# Patient Record
Sex: Male | Born: 1970 | Race: Black or African American | Hispanic: No | Smoking: Former smoker
Health system: Southern US, Community
[De-identification: ages and names within clinical notes are randomized; demographics above are authoritative.]

## PROBLEM LIST (undated history)

## (undated) DIAGNOSIS — I824Z1 Acute embolism and thrombosis of unspecified deep veins of right distal lower extremity: Secondary | ICD-10-CM

## (undated) DIAGNOSIS — F419 Anxiety disorder, unspecified: Secondary | ICD-10-CM

## (undated) DIAGNOSIS — F329 Major depressive disorder, single episode, unspecified: Secondary | ICD-10-CM

## (undated) DIAGNOSIS — F32A Depression, unspecified: Secondary | ICD-10-CM

## (undated) DIAGNOSIS — W3400XA Accidental discharge from unspecified firearms or gun, initial encounter: Secondary | ICD-10-CM

## (undated) HISTORY — PX: WRIST SURGERY: SHX841

## (undated) HISTORY — DX: Depression, unspecified: F32.A

## (undated) HISTORY — DX: Anxiety disorder, unspecified: F41.9

## (undated) HISTORY — DX: Acute embolism and thrombosis of unspecified deep veins of right distal lower extremity: I82.4Z1

## (undated) HISTORY — PX: BACK SURGERY: SHX140

---
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
## 2013-06-29 ENCOUNTER — Emergency Department (HOSPITAL_BASED_OUTPATIENT_CLINIC_OR_DEPARTMENT_OTHER)
Admission: EM | Admit: 2013-06-29 | Discharge: 2013-06-29 | Disposition: A | Discharge status: Home / Self Care | Payer: BC Managed Care – HMO | Attending: Emergency Medicine | Admitting: Emergency Medicine

## 2013-06-29 ENCOUNTER — Encounter (HOSPITAL_BASED_OUTPATIENT_CLINIC_OR_DEPARTMENT_OTHER): Payer: Self-pay | Admitting: Emergency Medicine

## 2013-06-29 DIAGNOSIS — Z791 Long term (current) use of non-steroidal anti-inflammatories (NSAID): Secondary | ICD-10-CM | POA: Insufficient documentation

## 2013-06-29 DIAGNOSIS — F17200 Nicotine dependence, unspecified, uncomplicated: Secondary | ICD-10-CM | POA: Insufficient documentation

## 2013-06-29 DIAGNOSIS — M25551 Pain in right hip: Secondary | ICD-10-CM

## 2013-06-29 DIAGNOSIS — M25559 Pain in unspecified hip: Secondary | ICD-10-CM | POA: Insufficient documentation

## 2013-06-29 MED ORDER — OXYCODONE-ACETAMINOPHEN 5-325 MG PO TABS: 2.0000 | ORAL_TABLET | Freq: Four times a day (QID) | ORAL | Status: DC | PRN

## 2013-06-29 MED ORDER — IBUPROFEN 800 MG PO TABS: 800.0000 mg | ORAL_TABLET | Freq: Three times a day (TID) | ORAL | Status: DC

## 2013-06-29 NOTE — ED Notes (Signed)
Patient here with ongoing bilateral hip pain for 2 months. Had cortisone injection in right hip 1 month ago and still continuing to have bilateral hip pain, denies any injury. Patient also has attempted to follow up with original MD unsuccessfully. Pain with any movement and states he is uncomfortable majority of the time, states diagnosed with bursitis.

## 2013-06-29 NOTE — ED Provider Notes (Signed)
CSN: [id number]     Arrival date & time 06/29/13  1058 History   First MD Initiated Contact with Patient 06/29/13 1239     Chief Complaint  Patient presents with  . Hip Pain   (Consider location/radiation/quality/duration/timing/severity/associated sxs/prior Treatment) HPI This 42 year old male has a 2 month gradual onset history of constant pain in both hips worse with position changes and movement better if he stays still, his pain is present 24 hours a day and moderately severe, he has no fever no IVDA no trauma no back pain no neck pain no radiation of his pain down his legs no weakness or numbness no change in bowel or bladder function no abdominal pain no vomiting no dysuria no rash, he was seen in [facility] emergency department and received temporary improvement without resolution with a suspected steroid shot in his right lateral hip area and temporary oral pain medicines,  History reviewed. No pertinent past medical history. History reviewed. No pertinent past surgical history. No family history on file. History  Substance Use Topics  . Smoking status: Current Every Day Smoker -- 0.50 packs/day    Types: Cigarettes  . Smokeless tobacco: Not on file  . Alcohol Use: Not on file    Review of Systems 10 Systems reviewed and are negative for acute change except as noted in the HPI. Allergies  Review of patient's allergies indicates no known allergies.  Home Medications   Current Outpatient Rx  Name  Route  Sig  Dispense  Refill  . ibuprofen (ADVIL,MOTRIN) 800 MG tablet   Oral   Take 1 tablet (800 mg total) by mouth 3 (three) times daily.   21 tablet   0   . oxyCODONE-acetaminophen (PERCOCET) 5-325 MG per tablet   Oral   Take 2 tablets by mouth every 6 (six) hours as needed for severe pain.   20 tablet   0    BP 125/74  Pulse 60  Temp(Src) 97.8 F (36.6 C) (Oral)  Resp 18  Ht 5\' 8"  (1.727 m)  Wt 195 lb (88.451 kg)  BMI 29.66 kg/m2  SpO2  100% Physical Exam  Nursing note and vitals reviewed. Constitutional:  Awake, alert, nontoxic appearance with baseline speech.  HENT:  Head: Atraumatic.  Eyes: Pupils are equal, round, and reactive to light. Right eye exhibits no discharge. Left eye exhibits no discharge.  Neck: Neck supple.  Cardiovascular: Normal rate and regular rhythm.   No murmur heard. Pulmonary/Chest: Effort normal and breath sounds normal. No respiratory distress. He has no wheezes. He has no rales. He exhibits no tenderness.  Abdominal: Soft. Bowel sounds are normal. He exhibits no mass. There is no tenderness. There is no rebound.  Musculoskeletal:       Thoracic back: He exhibits no tenderness.       Lumbar back: He exhibits no tenderness.  Bilateral lower extremities non tender except for bilateral lateral aspects of his hips over the greater trochanter region and lateral proximal thighs bilaterally without new rashes or color change, baseline ROM with intact DP pulses, CR<2 secs all digits bilaterally, sensation baseline light touch bilaterally for pt, motor symmetric bilateral 5 / 5 hip flexion, quadriceps, hamstrings, EHL, foot dorsiflexion, foot plantarflexion, gait somewhat antalgic but without apparent new ataxia. Back is nontender including sacroiliac joints.  Neurological: He is alert.  Mental status baseline for patient.  Upper extremity motor strength and sensation intact and symmetric bilaterally.  Skin: No rash noted.  Psychiatric: He has a normal  mood and affect.    ED Course  Procedures (including critical care time) Patient / Family / Caregiver informed of clinical course, understand medical decision-making process, and agree with plan. Labs Review Labs Reviewed - No data to display Imaging Review No results found.  EKG Interpretation   None       MDM   1. Bilateral hip pain    I doubt any other EMC precluding discharge at this time including, but not necessarily limited to the  following:SBI, cauda equina syndrome, radiculopathy.    Hurman Horn, MD 06/30/13 2107

## 2013-07-03 ENCOUNTER — Encounter: Payer: Self-pay | Admitting: Family Medicine

## 2013-07-03 ENCOUNTER — Ambulatory Visit (INDEPENDENT_AMBULATORY_CARE_PROVIDER_SITE_OTHER): Payer: BC Managed Care – HMO | Admitting: Family Medicine

## 2013-07-03 VITALS — BP 119/82 | HR 52 | Ht 68.0 in | Wt 192.0 lb

## 2013-07-03 DIAGNOSIS — M79609 Pain in unspecified limb: Secondary | ICD-10-CM

## 2013-07-03 DIAGNOSIS — M79604 Pain in right leg: Secondary | ICD-10-CM

## 2013-07-03 DIAGNOSIS — M545 Low back pain: Secondary | ICD-10-CM

## 2013-07-03 MED ORDER — PREDNISONE (PAK) 10 MG PO TABS: ORAL_TABLET | ORAL | Status: DC

## 2013-07-03 MED ORDER — OXYCODONE-ACETAMINOPHEN 5-325 MG PO TABS: 1.0000 | ORAL_TABLET | Freq: Four times a day (QID) | ORAL | Status: DC | PRN

## 2013-07-03 MED ORDER — CYCLOBENZAPRINE HCL 10 MG PO TABS: 10.0000 mg | ORAL_TABLET | Freq: Three times a day (TID) | ORAL | Status: DC | PRN

## 2013-07-03 NOTE — Patient Instructions (Signed)
You have lumbar radiculopathy (a pinched nerve in your low back going into both legs). Take tylenol for baseline pain relief (1-2 extra strength tabs 3x/day) A prednisone dose pack is the best option for immediate relief and may be prescribed. Day after finishing prednisone start aleve 2 tabs twice a day with food for pain and inflammation. Percocet as needed for severe pain (no driving on this medicine). Flexeril as needed for muscle spasms (no driving on this medicine if it makes you sleepy). Stay as active as possible. Physical therapy has been shown to be helpful as well. Strengthening of low back muscles, abdominal musculature are key for long term pain relief. If not improving, will consider further imaging (MRI). Follow up with me in 6 days.

## 2013-07-06 ENCOUNTER — Encounter: Payer: Self-pay | Admitting: Family Medicine

## 2013-07-06 NOTE — Progress Notes (Signed)
Patient ID: [name], male   DOB: 06/12/1971, 42 y.o.   MRN: [id number]  PCP: No PCP Per Patient  Subjective:   HPI: Patient is a 42 y.o. male here for bilateral hip/back pain.  Patient denies known injury. States he has had pain in low back radiating to groin on both sides for over a year. Worse with sitting, lying down. Has tried heating pad, icy hot, ibuprofen, percocet. Seemed to worsen about 4 months ago after sleeping on sister's floor No numbness or tingling. Had bursa injection on right side and after 2-3 days he improved some but pain worsened again. No bowel/bladder dysfunction.  History reviewed. No pertinent past medical history.  No current outpatient prescriptions on file prior to visit.   No current facility-administered medications on file prior to visit.    Past Surgical History  Procedure Laterality Date  . Wrist surgery Right     No Known Allergies  History   Social History  . Marital Status: Married    Spouse Name: N/A    Number of Children: N/A  . Years of Education: N/A   Occupational History  . Not on file.   Social History Main Topics  . Smoking status: Current Every Day Smoker -- 0.50 packs/day    Types: Cigarettes  . Smokeless tobacco: Not on file  . Alcohol Use: Not on file  . Drug Use: Not on file  . Sexual Activity: Not on file   Other Topics Concern  . Not on file   Social History Narrative  . No narrative on file    Family History  Problem Relation Age of Onset  . Sudden death Neg Hx   . Hypertension Neg Hx   . Hyperlipidemia Neg Hx   . Heart attack Neg Hx   . Diabetes Neg Hx     BP 119/82  Pulse 52  Ht 5\' 8"  (1.727 m)  Wt 192 lb (87.091 kg)  BMI 29.20 kg/m2  Review of Systems: See HPI above.    Objective:  Physical Exam:  Gen: NAD  Back: No gross deformity, scoliosis. TTP bilateral paraspinal lumbar regions, diffusely about hips.  No midline or bony TTP. FROM with pain on flexion. Strength 4/5 with  knee flexion and extension, left ankle dorsiflexion.  5/5 other muscle groups.   2+ MSRs in patellar and achilles tendons, equal bilaterally. Negative SLRs. Sensation intact to light touch bilaterally. Negative logroll bilateral hips Negative fabers and piriformis stretches.    Assessment & Plan:  1. Low back pain - with radiation into both groins but normal hip exam.  Has weakness on both sides but limited by pain.  Will start with prednisone dose pack, flexeril and percocet as needed.  Consider formal PT.  MRI if not improving.  F/u in 1 week.

## 2013-07-14 ENCOUNTER — Encounter: Payer: Self-pay | Admitting: Family Medicine

## 2013-07-14 ENCOUNTER — Ambulatory Visit: Payer: BC Managed Care – HMO | Admitting: Family Medicine

## 2013-07-14 DIAGNOSIS — M545 Low back pain: Secondary | ICD-10-CM | POA: Insufficient documentation

## 2013-07-14 NOTE — Assessment & Plan Note (Signed)
with radiation into both groins but normal hip exam.  Has weakness on both sides but limited by pain.  Will start with prednisone dose pack, flexeril and percocet as needed.  Consider formal PT.  MRI if not improving.  F/u in 1 week.

## 2013-07-15 ENCOUNTER — Ambulatory Visit: Payer: Self-pay | Admitting: Family Medicine

## 2013-08-08 ENCOUNTER — Ambulatory Visit (INDEPENDENT_AMBULATORY_CARE_PROVIDER_SITE_OTHER): Payer: BC Managed Care – HMO | Admitting: Family Medicine

## 2013-08-08 ENCOUNTER — Encounter: Payer: Self-pay | Admitting: Family Medicine

## 2013-08-08 VITALS — BP 147/75 | HR 76 | Ht 68.0 in | Wt 195.0 lb

## 2013-08-08 DIAGNOSIS — M545 Low back pain, unspecified: Secondary | ICD-10-CM

## 2013-08-08 DIAGNOSIS — M79604 Pain in right leg: Secondary | ICD-10-CM

## 2013-08-08 DIAGNOSIS — M79609 Pain in unspecified limb: Secondary | ICD-10-CM

## 2013-08-08 DIAGNOSIS — M79605 Pain in left leg: Principal | ICD-10-CM

## 2013-08-08 MED ORDER — CYCLOBENZAPRINE HCL 10 MG PO TABS: 10.0000 mg | ORAL_TABLET | Freq: Three times a day (TID) | ORAL | Status: AC | PRN

## 2013-08-08 NOTE — Patient Instructions (Signed)
You have lumbar radiculopathy (a pinched nerve in your low back going into both legs). Take tylenol for baseline pain relief (1-2 extra strength tabs 3x/day) Aleve 2 tabs twice a day with food for pain and inflammation. Flexeril as needed for muscle spasms (no driving on this medicine if it makes you sleepy). Stay as active as possible. Physical therapy has been shown to be helpful as well - start this and do home exercises on days you don't go to therapy. Strengthening of low back muscles, abdominal musculature are key for long term pain relief. If not improving, will consider further imaging (MRI). Follow up with me in 1 month to 6 weeks. Can call me sooner if therapy not working to go ahead with MRI.

## 2013-08-13 ENCOUNTER — Encounter: Payer: Self-pay | Admitting: Family Medicine

## 2013-08-13 NOTE — Progress Notes (Signed)
Patient ID: [name], male   DOB: 1971/04/23, 43 y.o.   MRN: [id number]  PCP: No PCP Per Patient  Subjective:   HPI: Patient is a 43 y.o. male here for bilateral hip/back pain.  12/18: Patient denies known injury. States he has had pain in low back radiating to groin on both sides for over a year. Worse with sitting, lying down. Has tried heating pad, icy hot, ibuprofen, percocet. Seemed to worsen about 4 months ago after sleeping on sister's floor No numbness or tingling. Had bursa injection on right side and after 2-3 days he improved some but pain worsened again. No bowel/bladder dysfunction.  1/23: Patient reports the prednisone and flexeril made him functional, some improvement. Reports now having left foot going to sleep occasionally. No bowel/bladder dysfunction. Using muscle relaxant still. Pain level a 5/10 currently.  History reviewed. No pertinent past medical history.  Current Outpatient Prescriptions on File Prior to Visit  Medication Sig Dispense Refill  . oxyCODONE-acetaminophen (PERCOCET) 5-325 MG per tablet Take 1 tablet by mouth every 6 (six) hours as needed for severe pain.  60 tablet  0  . predniSONE (STERAPRED UNI-PAK) 10 MG tablet 6 tabs po day 1, 5 tabs po day 2, 4 tabs po day 3, 3 tabs po day 4, 2 tabs po day 5, 1 tab po day 6  21 tablet  0   No current facility-administered medications on file prior to visit.    Past Surgical History  Procedure Laterality Date  . Wrist surgery Right     No Known Allergies  History   Social History  . Marital Status: Married    Spouse Name: N/A    Number of Children: N/A  . Years of Education: N/A   Occupational History  . Not on file.   Social History Main Topics  . Smoking status: Current Every Day Smoker -- 0.50 packs/day    Types: Cigarettes  . Smokeless tobacco: Not on file  . Alcohol Use: Not on file  . Drug Use: Not on file  . Sexual Activity: Not on file   Other Topics Concern  . Not on  file   Social History Narrative  . No narrative on file    Family History  Problem Relation Age of Onset  . Sudden death Neg Hx   . Hypertension Neg Hx   . Hyperlipidemia Neg Hx   . Heart attack Neg Hx   . Diabetes Neg Hx     BP 147/75  Pulse 76  Ht 5\' 8"  (1.727 m)  Wt 195 lb (88.451 kg)  BMI 29.66 kg/m2  Review of Systems: See HPI above.    Objective:  Physical Exam:  Gen: NAD  Back: No gross deformity, scoliosis. TTP bilateral paraspinal lumbar regions, diffusely about hips.  No midline or bony TTP. FROM with pain on flexion. Strength 4/5 with knee flexion and extension, left ankle dorsiflexion.  5/5 other muscle groups.   2+ MSRs in patellar and achilles tendons, equal bilaterally. Negative SLRs. Sensation intact to light touch bilaterally. Negative logroll bilateral hips Negative fabers and piriformis stretches.    Assessment & Plan:  1. Low back pain - with radiation into both groins but normal hip exam.  Exam otherwise unchanged.  Prednisone only helped some.  Discussed options moving forward.  Will start with formal physical therapy.  If not improving still will go ahead with MRI to further assess.

## 2013-08-13 NOTE — Assessment & Plan Note (Signed)
with radiation into both groins but normal hip exam.  Exam otherwise unchanged.  Prednisone only helped some.  Discussed options moving forward.  Will start with formal physical therapy.  If not improving still will go ahead with MRI to further assess.

## 2013-08-15 ENCOUNTER — Ambulatory Visit: Payer: BC Managed Care – HMO | Admitting: Family Medicine

## 2013-08-18 ENCOUNTER — Ambulatory Visit: Payer: BC Managed Care – HMO | Attending: Family Medicine | Admitting: Rehabilitation

## 2013-08-28 ENCOUNTER — Ambulatory Visit: Payer: BC Managed Care – HMO | Admitting: Rehabilitation

## 2013-08-29 ENCOUNTER — Ambulatory Visit: Payer: BC Managed Care – HMO | Admitting: Family Medicine

## 2015-05-23 ENCOUNTER — Encounter (HOSPITAL_BASED_OUTPATIENT_CLINIC_OR_DEPARTMENT_OTHER): Payer: Self-pay | Admitting: Emergency Medicine

## 2015-05-23 ENCOUNTER — Emergency Department (HOSPITAL_BASED_OUTPATIENT_CLINIC_OR_DEPARTMENT_OTHER): Payer: Self-pay

## 2015-05-23 ENCOUNTER — Emergency Department (HOSPITAL_BASED_OUTPATIENT_CLINIC_OR_DEPARTMENT_OTHER)
Admission: EM | Admit: 2015-05-23 | Discharge: 2015-05-23 | Disposition: A | Discharge status: Home / Self Care | Payer: Self-pay | Attending: Emergency Medicine | Admitting: Emergency Medicine

## 2015-05-23 DIAGNOSIS — S51811D Laceration without foreign body of right forearm, subsequent encounter: Secondary | ICD-10-CM | POA: Insufficient documentation

## 2015-05-23 DIAGNOSIS — Z720 Tobacco use: Secondary | ICD-10-CM | POA: Insufficient documentation

## 2015-05-23 DIAGNOSIS — Z23 Encounter for immunization: Secondary | ICD-10-CM | POA: Insufficient documentation

## 2015-05-23 DIAGNOSIS — W25XXXD Contact with sharp glass, subsequent encounter: Secondary | ICD-10-CM | POA: Insufficient documentation

## 2015-05-23 MED ORDER — TETANUS-DIPHTH-ACELL PERTUSSIS 5-2.5-18.5 LF-MCG/0.5 IM SUSP
0.5000 mL | Freq: Once | INTRAMUSCULAR | Status: AC
Start: 2015-05-23 — End: 2015-05-23
  Administered 2015-05-23: 0.5 mL via INTRAMUSCULAR
  Filled 2015-05-23: qty 0.5

## 2015-05-23 NOTE — Discharge Instructions (Signed)
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
## 2015-05-23 NOTE — ED Provider Notes (Signed)
CSN: [id number]     Arrival date & time 05/23/15  1129 History   First MD Initiated Contact with Patient 05/23/15 1149     Chief Complaint  Patient presents with  . Laceration     (Consider location/radiation/quality/duration/timing/severity/associated sxs/prior Treatment) Patient is a 44 y.o. male presenting with skin laceration.  Laceration Location: R forearm. Length (cm):  3 Bleeding: controlled   Time since incident:  1 day Laceration mechanism:  Broken glass Pain details:    Quality:  Aching Foreign body present:  No foreign bodies Relieved by:  Nothing Worsened by:  Movement and pressure Tetanus status:  Unknown   History reviewed. No pertinent past medical history. Past Surgical History  Procedure Laterality Date  . Wrist surgery Right    Family History  Problem Relation Age of Onset  . Sudden death Neg Hx   . Hypertension Neg Hx   . Hyperlipidemia Neg Hx   . Heart attack Neg Hx   . Diabetes Neg Hx    Social History  Substance Use Topics  . Smoking status: Current Every Day Smoker -- 0.50 packs/day    Types: Cigarettes  . Smokeless tobacco: None  . Alcohol Use: None    Review of Systems  All other systems reviewed and are negative.     Allergies  Review of patient's allergies indicates no known allergies.  Home Medications   Prior to Admission medications   Not on File   BP 133/73 mmHg  Pulse 69  Temp(Src) 98.4 F (36.9 C) (Oral)  Resp 16  Ht 5\' 7"  (1.702 m)  Wt 191 lb (86.637 kg)  BMI 29.91 kg/m2  SpO2 100% Physical Exam  Constitutional: He is oriented to person, place, and time. He appears well-developed and well-nourished.  HENT:  Head: Normocephalic and atraumatic.  Eyes: Conjunctivae and EOM are normal.  Neck: Normal range of motion. Neck supple.  Cardiovascular: Normal rate, regular rhythm and normal heart sounds.   Pulmonary/Chest: Effort normal and breath sounds normal. No respiratory distress.  Abdominal: He exhibits no  distension. There is no tenderness. There is no rebound and no guarding.  Musculoskeletal: Normal range of motion.       Right forearm: He exhibits tenderness and swelling (hematoma underlying laceration of forearm which is stapled, no overlying warmth or erythema).  NV intact distal to injury, but with subj tingling in 4-5 digit of R hand, FROM  Neurological: He is alert and oriented to person, place, and time.  Skin: Skin is warm and dry.  Vitals reviewed.   ED Course  Procedures (including critical care time) Labs Review Labs Reviewed - No data to display  Imaging Review Dg Forearm Right  05/23/2015  CLINICAL DATA:  Injured forearm today. Put arm through a glass window. Assess for radiopaque foreign body. EXAM: RIGHT FOREARM - 2 VIEW COMPARISON:  None. FINDINGS: The distal radius plate and screws is intact. No wrist, forearm or elbow fractures identified. There is diffuse soft tissue swelling involving the proximal forearm with surgical skin staples in place. No definite radiopaque foreign body is identified. IMPRESSION: No fracture or radiopaque foreign body. Electronically Signed   By: Marijo Sanes M.D.   On: 05/23/2015 12:49   I have personally reviewed and evaluated these images and lab results as part of my medical decision-making.   EKG Interpretation None      MDM   Final diagnoses:  Forearm laceration, right, subsequent encounter    43 y.o. male with pertinent PMH of recent  laceration to R forearm, seen at OSH and had wound stapled presents with request for tetanus update as well as concern for swelling at site of laceration. Patient states no imaging was performed. Wound was stapled and bleeding was controlled. On arrival today vitals signs and physical exam as above. There is a hematoma overlying the laceration is significant, however the patient still has neurovascular function and has both ulnar and radial pulses of his right arm.  Suspect this is resolving venous  bleeding, tetanus updated, x-ray obtained to rule out retained glass which was unremarkable. Discharged home in stable condition to follow-up with hand surgery as needed    I have reviewed all laboratory and imaging studies if ordered as above  1. Forearm laceration, right, subsequent encounter         Debby Freiberg, MD 05/23/15 1444

## 2015-05-23 NOTE — ED Notes (Signed)
Pt made aware to return if symptoms worsen or if any life threatening symptoms occur.   

## 2016-06-06 DIAGNOSIS — K612 Anorectal abscess: Secondary | ICD-10-CM | POA: Insufficient documentation

## 2017-07-19 ENCOUNTER — Emergency Department (HOSPITAL_BASED_OUTPATIENT_CLINIC_OR_DEPARTMENT_OTHER)
Admission: EM | Admit: 2017-07-19 | Discharge: 2017-07-19 | Disposition: A | Discharge status: Home / Self Care | Payer: Self-pay | Attending: Emergency Medicine | Admitting: Emergency Medicine

## 2017-07-19 ENCOUNTER — Encounter (HOSPITAL_BASED_OUTPATIENT_CLINIC_OR_DEPARTMENT_OTHER): Payer: Self-pay | Admitting: Emergency Medicine

## 2017-07-19 DIAGNOSIS — F17210 Nicotine dependence, cigarettes, uncomplicated: Secondary | ICD-10-CM | POA: Insufficient documentation

## 2017-07-19 DIAGNOSIS — R369 Urethral discharge, unspecified: Secondary | ICD-10-CM | POA: Insufficient documentation

## 2017-07-19 DIAGNOSIS — Z202 Contact with and (suspected) exposure to infections with a predominantly sexual mode of transmission: Secondary | ICD-10-CM | POA: Insufficient documentation

## 2017-07-19 MED ORDER — CEFTRIAXONE SODIUM 250 MG IJ SOLR
250.0000 mg | Freq: Once | INTRAMUSCULAR | Status: AC
  Administered 2017-07-19: 250 mg via INTRAMUSCULAR
  Filled 2017-07-19: qty 250

## 2017-07-19 MED ORDER — LIDOCAINE HCL (PF) 1 % IJ SOLN
INTRAMUSCULAR | Status: AC
  Filled 2017-07-19: qty 5

## 2017-07-19 MED ORDER — AZITHROMYCIN 250 MG PO TABS
1000.0000 mg | ORAL_TABLET | Freq: Once | ORAL | Status: AC
  Administered 2017-07-19: 1000 mg via ORAL
  Filled 2017-07-19: qty 4
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
## 2017-07-19 NOTE — ED Notes (Signed)
Provider notified that "Shot time" was over
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
## 2017-07-19 NOTE — ED Notes (Signed)
Pt understood dc material. NAD noted. 

## 2017-07-19 NOTE — Discharge Instructions (Signed)
It was our pleasure to provide your ER care today - we hope that you feel better.  Follow up with primary care doctor in the next 1-2 weeks.  Return to ER if worse, new symptoms, other concern.

## 2017-07-20 LAB — GC/CHLAMYDIA PROBE AMP (~~LOC~~) NOT AT ARMC
Chlamydia: NEGATIVE
Neisseria Gonorrhea: NEGATIVE

## 2017-07-26 DIAGNOSIS — S82142A Displaced bicondylar fracture of left tibia, initial encounter for closed fracture: Secondary | ICD-10-CM | POA: Insufficient documentation

## 2017-12-29 ENCOUNTER — Inpatient Hospital Stay (HOSPITAL_COMMUNITY): Payer: Self-pay | Admitting: Anesthesiology

## 2017-12-29 ENCOUNTER — Emergency Department (HOSPITAL_COMMUNITY): Payer: Self-pay | Admitting: Certified Registered"

## 2017-12-29 ENCOUNTER — Other Ambulatory Visit: Payer: Self-pay

## 2017-12-29 ENCOUNTER — Encounter (HOSPITAL_COMMUNITY): Payer: Self-pay | Admitting: *Deleted

## 2017-12-29 ENCOUNTER — Inpatient Hospital Stay (HOSPITAL_COMMUNITY): Payer: Self-pay

## 2017-12-29 ENCOUNTER — Inpatient Hospital Stay (HOSPITAL_COMMUNITY)
Admission: EM | Admit: 2017-12-29 | Discharge: 2018-01-14 | DRG: 957 | Disposition: A | Discharge status: Rehabilitation Facility | Payer: Self-pay | Attending: General Surgery | Admitting: General Surgery

## 2017-12-29 ENCOUNTER — Encounter (HOSPITAL_COMMUNITY): Admission: EM | Disposition: A | Discharge status: Rehabilitation Facility | Payer: Self-pay | Source: Home / Self Care

## 2017-12-29 ENCOUNTER — Emergency Department (HOSPITAL_COMMUNITY): Payer: Self-pay

## 2017-12-29 DIAGNOSIS — S91331A Puncture wound without foreign body, right foot, initial encounter: Secondary | ICD-10-CM | POA: Diagnosis present

## 2017-12-29 DIAGNOSIS — J9601 Acute respiratory failure with hypoxia: Secondary | ICD-10-CM | POA: Diagnosis present

## 2017-12-29 DIAGNOSIS — R402253 Coma scale, best verbal response, oriented, at hospital admission: Secondary | ICD-10-CM | POA: Diagnosis present

## 2017-12-29 DIAGNOSIS — F1010 Alcohol abuse, uncomplicated: Secondary | ICD-10-CM | POA: Diagnosis present

## 2017-12-29 DIAGNOSIS — S91031A Puncture wound without foreign body, right ankle, initial encounter: Secondary | ICD-10-CM

## 2017-12-29 DIAGNOSIS — S32302B Unspecified fracture of left ilium, initial encounter for open fracture: Secondary | ICD-10-CM | POA: Diagnosis present

## 2017-12-29 DIAGNOSIS — F411 Generalized anxiety disorder: Secondary | ICD-10-CM

## 2017-12-29 DIAGNOSIS — Z978 Presence of other specified devices: Secondary | ICD-10-CM

## 2017-12-29 DIAGNOSIS — K117 Disturbances of salivary secretion: Secondary | ICD-10-CM

## 2017-12-29 DIAGNOSIS — S36593A Other injury of sigmoid colon, initial encounter: Secondary | ICD-10-CM | POA: Diagnosis present

## 2017-12-29 DIAGNOSIS — K659 Peritonitis, unspecified: Secondary | ICD-10-CM | POA: Diagnosis present

## 2017-12-29 DIAGNOSIS — Z720 Tobacco use: Secondary | ICD-10-CM

## 2017-12-29 DIAGNOSIS — W3400XA Accidental discharge from unspecified firearms or gun, initial encounter: Secondary | ICD-10-CM

## 2017-12-29 DIAGNOSIS — F17200 Nicotine dependence, unspecified, uncomplicated: Secondary | ICD-10-CM | POA: Diagnosis present

## 2017-12-29 DIAGNOSIS — R6510 Systemic inflammatory response syndrome (SIRS) of non-infectious origin without acute organ dysfunction: Secondary | ICD-10-CM

## 2017-12-29 DIAGNOSIS — S31139A Puncture wound of abdominal wall without foreign body, unspecified quadrant without penetration into peritoneal cavity, initial encounter: Secondary | ICD-10-CM

## 2017-12-29 DIAGNOSIS — J9690 Respiratory failure, unspecified, unspecified whether with hypoxia or hypercapnia: Secondary | ICD-10-CM

## 2017-12-29 DIAGNOSIS — S31130A Puncture wound of abdominal wall without foreign body, right upper quadrant without penetration into peritoneal cavity, initial encounter: Secondary | ICD-10-CM | POA: Diagnosis present

## 2017-12-29 DIAGNOSIS — S91339A Puncture wound without foreign body, unspecified foot, initial encounter: Secondary | ICD-10-CM

## 2017-12-29 DIAGNOSIS — R339 Retention of urine, unspecified: Secondary | ICD-10-CM | POA: Diagnosis not present

## 2017-12-29 DIAGNOSIS — G8918 Other acute postprocedural pain: Secondary | ICD-10-CM

## 2017-12-29 DIAGNOSIS — R58 Hemorrhage, not elsewhere classified: Secondary | ICD-10-CM | POA: Diagnosis present

## 2017-12-29 DIAGNOSIS — R05 Cough: Secondary | ICD-10-CM

## 2017-12-29 DIAGNOSIS — R059 Cough, unspecified: Secondary | ICD-10-CM

## 2017-12-29 DIAGNOSIS — E872 Acidosis: Secondary | ICD-10-CM | POA: Diagnosis present

## 2017-12-29 DIAGNOSIS — K567 Ileus, unspecified: Secondary | ICD-10-CM | POA: Diagnosis present

## 2017-12-29 DIAGNOSIS — R7989 Other specified abnormal findings of blood chemistry: Secondary | ICD-10-CM

## 2017-12-29 DIAGNOSIS — R0989 Other specified symptoms and signs involving the circulatory and respiratory systems: Secondary | ICD-10-CM

## 2017-12-29 DIAGNOSIS — R069 Unspecified abnormalities of breathing: Secondary | ICD-10-CM

## 2017-12-29 DIAGNOSIS — R410 Disorientation, unspecified: Secondary | ICD-10-CM

## 2017-12-29 DIAGNOSIS — Z87898 Personal history of other specified conditions: Secondary | ICD-10-CM

## 2017-12-29 DIAGNOSIS — R402143 Coma scale, eyes open, spontaneous, at hospital admission: Secondary | ICD-10-CM | POA: Diagnosis present

## 2017-12-29 DIAGNOSIS — S36498A Other injury of other part of small intestine, initial encounter: Principal | ICD-10-CM | POA: Diagnosis present

## 2017-12-29 DIAGNOSIS — D62 Acute posthemorrhagic anemia: Secondary | ICD-10-CM | POA: Diagnosis present

## 2017-12-29 DIAGNOSIS — D72829 Elevated white blood cell count, unspecified: Secondary | ICD-10-CM

## 2017-12-29 DIAGNOSIS — R402363 Coma scale, best motor response, obeys commands, at hospital admission: Secondary | ICD-10-CM | POA: Diagnosis present

## 2017-12-29 DIAGNOSIS — Z4659 Encounter for fitting and adjustment of other gastrointestinal appliance and device: Secondary | ICD-10-CM

## 2017-12-29 DIAGNOSIS — Y9289 Other specified places as the place of occurrence of the external cause: Secondary | ICD-10-CM

## 2017-12-29 DIAGNOSIS — Y249XXA Unspecified firearm discharge, undetermined intent, initial encounter: Secondary | ICD-10-CM | POA: Diagnosis present

## 2017-12-29 HISTORY — PX: LAPAROTOMY: SHX154

## 2017-12-29 HISTORY — DX: Accidental discharge from unspecified firearms or gun, initial encounter: W34.00XA

## 2017-12-29 LAB — CBC
HCT: 43.1 % (ref 39.0–52.0)
HEMATOCRIT: 31.6 % — AB (ref 39.0–52.0)
HEMOGLOBIN: 9.9 g/dL — AB (ref 13.0–17.0)
Hemoglobin: 13.1 g/dL (ref 13.0–17.0)
MCH: 26.5 pg (ref 26.0–34.0)
MCH: 26.4 pg (ref 26.0–34.0)
MCHC: 30.4 g/dL (ref 30.0–36.0)
MCHC: 31.3 g/dL (ref 30.0–36.0)
MCV: 87.2 fL (ref 78.0–100.0)
MCV: 84.3 fL (ref 78.0–100.0)
Platelets: 253 10*3/uL (ref 150–400)
Platelets: 200 10*3/uL (ref 150–400)
RBC: 4.94 MIL/uL (ref 4.22–5.81)
RBC: 3.75 MIL/uL — ABNORMAL LOW (ref 4.22–5.81)
RDW: 14.2 % (ref 11.5–15.5)
RDW: 14.2 % (ref 11.5–15.5)
WBC: 12.8 10*3/uL — ABNORMAL HIGH (ref 4.0–10.5)
WBC: 19.3 10*3/uL — AB (ref 4.0–10.5)

## 2017-12-29 LAB — I-STAT CHEM 8, ED
BUN: 13 mg/dL (ref 6–20)
CHLORIDE: 102 mmol/L (ref 101–111)
Calcium, Ion: 0.99 mmol/L — ABNORMAL LOW (ref 1.15–1.40)
Creatinine, Ser: 1.3 mg/dL — ABNORMAL HIGH (ref 0.61–1.24)
Glucose, Bld: 147 mg/dL — ABNORMAL HIGH (ref 65–99)
HEMATOCRIT: 44 % (ref 39.0–52.0)
Hemoglobin: 15 g/dL (ref 13.0–17.0)
Potassium: 3.7 mmol/L (ref 3.5–5.1)
SODIUM: 135 mmol/L (ref 135–145)
TCO2: 17 mmol/L — ABNORMAL LOW (ref 22–32)

## 2017-12-29 LAB — COMPREHENSIVE METABOLIC PANEL
ALBUMIN: 3.9 g/dL (ref 3.5–5.0)
ALBUMIN: 2.6 g/dL — AB (ref 3.5–5.0)
ALK PHOS: 56 U/L (ref 38–126)
ALK PHOS: 37 U/L — AB (ref 38–126)
ALT: 11 U/L — AB (ref 17–63)
ALT: 9 U/L — ABNORMAL LOW (ref 17–63)
ANION GAP: 10 (ref 5–15)
AST: 23 U/L (ref 15–41)
AST: 20 U/L (ref 15–41)
Anion gap: 16 — ABNORMAL HIGH (ref 5–15)
BILIRUBIN TOTAL: 0.3 mg/dL (ref 0.3–1.2)
BILIRUBIN TOTAL: 0.4 mg/dL (ref 0.3–1.2)
BUN: 12 mg/dL (ref 6–20)
BUN: 11 mg/dL (ref 6–20)
CALCIUM: 8.3 mg/dL — AB (ref 8.9–10.3)
CO2: 15 mmol/L — ABNORMAL LOW (ref 22–32)
CO2: 22 mmol/L (ref 22–32)
Calcium: 7.1 mg/dL — ABNORMAL LOW (ref 8.9–10.3)
Chloride: 102 mmol/L (ref 101–111)
Chloride: 106 mmol/L (ref 101–111)
Creatinine, Ser: 1.25 mg/dL — ABNORMAL HIGH (ref 0.61–1.24)
Creatinine, Ser: 1.08 mg/dL (ref 0.61–1.24)
GFR calc Af Amer: >60 mL/min (ref >60)
GFR calc Af Amer: >60 mL/min (ref >60)
GFR calc non Af Amer: >60 mL/min (ref >60)
GFR calc non Af Amer: >60 mL/min (ref >60)
Glucose, Bld: 154 mg/dL — ABNORMAL HIGH (ref 65–99)
Glucose, Bld: 113 mg/dL — ABNORMAL HIGH (ref 65–99)
POTASSIUM: 4.2 mmol/L (ref 3.5–5.1)
Potassium: 3.8 mmol/L (ref 3.5–5.1)
SODIUM: 138 mmol/L (ref 135–145)
Sodium: 133 mmol/L — ABNORMAL LOW (ref 135–145)
TOTAL PROTEIN: 6.5 g/dL (ref 6.5–8.1)
Total Protein: 4.2 g/dL — ABNORMAL LOW (ref 6.5–8.1)

## 2017-12-29 LAB — PREPARE FRESH FROZEN PLASMA

## 2017-12-29 LAB — POCT I-STAT 7, (LYTES, BLD GAS, ICA,H+H)
ACID-BASE DEFICIT: 5 mmol/L — AB (ref 0.0–2.0)
ACID-BASE DEFICIT: 9 mmol/L — AB (ref 0.0–2.0)
BICARBONATE: 21.6 mmol/L (ref 20.0–28.0)
Bicarbonate: 19.1 mmol/L — ABNORMAL LOW (ref 20.0–28.0)
CALCIUM ION: 1.08 mmol/L — AB (ref 1.15–1.40)
Calcium, Ion: 1.02 mmol/L — ABNORMAL LOW (ref 1.15–1.40)
HCT: 36 % — ABNORMAL LOW (ref 39.0–52.0)
HEMATOCRIT: 28 % — AB (ref 39.0–52.0)
HEMOGLOBIN: 12.2 g/dL — AB (ref 13.0–17.0)
Hemoglobin: 9.5 g/dL — ABNORMAL LOW (ref 13.0–17.0)
O2 SAT: 100 %
O2 Saturation: 100 %
PH ART: 7.247 — AB (ref 7.350–7.450)
PH ART: 7.31 — AB (ref 7.350–7.450)
POTASSIUM: 4 mmol/L (ref 3.5–5.1)
Patient temperature: 35.4
Potassium: 4 mmol/L (ref 3.5–5.1)
SODIUM: 135 mmol/L (ref 135–145)
SODIUM: 139 mmol/L (ref 135–145)
TCO2: 23 mmol/L (ref 22–32)
TCO2: 20 mmol/L — ABNORMAL LOW (ref 22–32)
pCO2 arterial: 42.1 mmHg (ref 32.0–48.0)
pCO2 arterial: 42.7 mmHg (ref 32.0–48.0)
pO2, Arterial: 519 mmHg — ABNORMAL HIGH (ref 83.0–108.0)
pO2, Arterial: 561 mmHg — ABNORMAL HIGH (ref 83.0–108.0)

## 2017-12-29 LAB — MRSA PCR SCREENING: MRSA by PCR: NEGATIVE

## 2017-12-29 LAB — ABO/RH: ABO/RH(D): O POS

## 2017-12-29 LAB — PROTIME-INR
INR: 1.07
INR: 1.36
Prothrombin Time: 13.8 seconds (ref 11.4–15.2)
Prothrombin Time: 16.6 seconds — ABNORMAL HIGH (ref 11.4–15.2)

## 2017-12-29 LAB — BPAM FFP
Blood Product Expiration Date: 201906192359
Blood Product Expiration Date: 201906192359
ISSUE DATE / TIME: 201906150237
ISSUE DATE / TIME: 201906150237
Unit Type and Rh: 6200
Unit Type and Rh: 6200

## 2017-12-29 LAB — TRIGLYCERIDES: Triglycerides: 136 mg/dL (ref <150)

## 2017-12-29 LAB — CDS SEROLOGY

## 2017-12-29 LAB — HIV ANTIBODY (ROUTINE TESTING W REFLEX): HIV Screen 4th Generation wRfx: NONREACTIVE

## 2017-12-29 LAB — I-STAT CG4 LACTIC ACID, ED: LACTIC ACID, VENOUS: 4.58 mmol/L — AB (ref 0.5–1.9)

## 2017-12-29 LAB — ETHANOL: ALCOHOL ETHYL (B): 210 mg/dL — AB (ref <10)

## 2017-12-29 SURGERY — LAPAROTOMY, EXPLORATORY
Anesthesia: General | Site: Abdomen

## 2017-12-29 MED ORDER — PROPOFOL 500 MG/50ML IV EMUL
INTRAVENOUS | Status: DC | PRN
  Administered 2017-12-29: 50 ug/kg/min via INTRAVENOUS

## 2017-12-29 MED ORDER — FENTANYL BOLUS VIA INFUSION
50.0000 ug | INTRAVENOUS | Status: DC | PRN
  Administered 2017-12-30: 50 ug via INTRAVENOUS
  Administered 2017-12-31 (×2): 100 ug via INTRAVENOUS
  Administered 2017-12-31 – 2018-01-01 (×11): 50 ug via INTRAVENOUS
  Filled 2017-12-29 (×6): qty 50

## 2017-12-29 MED ORDER — SODIUM CHLORIDE 0.9 % IV SOLN
INTRAVENOUS | Status: DC | PRN
  Administered 2017-12-29 (×2): via INTRAVENOUS

## 2017-12-29 MED ORDER — FENTANYL CITRATE (PF) 100 MCG/2ML IJ SOLN: 25.0000 ug | INTRAMUSCULAR | Status: DC | PRN

## 2017-12-29 MED ORDER — ROCURONIUM BROMIDE 100 MG/10ML IV SOLN
INTRAVENOUS | Status: DC | PRN
  Administered 2017-12-29: 50 mg via INTRAVENOUS

## 2017-12-29 MED ORDER — ENOXAPARIN SODIUM 40 MG/0.4ML ~~LOC~~ SOLN
40.0000 mg | SUBCUTANEOUS | Status: DC
  Administered 2017-12-30 – 2018-01-14 (×14): 40 mg via SUBCUTANEOUS
  Filled 2017-12-29 (×14): qty 0.4

## 2017-12-29 MED ORDER — SUCCINYLCHOLINE CHLORIDE 20 MG/ML IJ SOLN
INTRAMUSCULAR | Status: DC | PRN
  Administered 2017-12-29: 140 mg via INTRAVENOUS

## 2017-12-29 MED ORDER — MIDAZOLAM HCL 2 MG/2ML IJ SOLN
INTRAMUSCULAR | Status: AC
  Filled 2017-12-29: qty 2

## 2017-12-29 MED ORDER — CALCIUM GLUCONATE 10 % IV SOLN
1.0000 g | Freq: Once | INTRAVENOUS | Status: AC
  Administered 2017-12-29: 1 g via INTRAVENOUS
  Filled 2017-12-29: qty 10

## 2017-12-29 MED ORDER — PROPOFOL 1000 MG/100ML IV EMUL
0.0000 ug/kg/min | INTRAVENOUS | Status: DC
  Administered 2017-12-29 – 2017-12-30 (×10): 50 ug/kg/min via INTRAVENOUS
  Administered 2017-12-31: 30 ug/kg/min via INTRAVENOUS
  Administered 2017-12-31: 50 ug/kg/min via INTRAVENOUS
  Administered 2017-12-31: 40 ug/kg/min via INTRAVENOUS
  Administered 2017-12-31: 50 ug/kg/min via INTRAVENOUS
  Administered 2017-12-31: 40 ug/kg/min via INTRAVENOUS
  Administered 2017-12-31: 75 ug/kg/min via INTRAVENOUS
  Administered 2018-01-01 (×2): 35 ug/kg/min via INTRAVENOUS
  Administered 2018-01-01: 40 ug/kg/min via INTRAVENOUS
  Administered 2018-01-01: 35 ug/kg/min via INTRAVENOUS
  Administered 2018-01-01: 40 ug/kg/min via INTRAVENOUS
  Administered 2018-01-02: 35 ug/kg/min via INTRAVENOUS
  Administered 2018-01-02: 40 ug/kg/min via INTRAVENOUS
  Administered 2018-01-02 (×3): 35 ug/kg/min via INTRAVENOUS
  Administered 2018-01-03: 20 ug/kg/min via INTRAVENOUS
  Administered 2018-01-03: 45 ug/kg/min via INTRAVENOUS
  Administered 2018-01-03: 40 ug/kg/min via INTRAVENOUS
  Administered 2018-01-03: 45 ug/kg/min via INTRAVENOUS
  Administered 2018-01-04: 40 ug/kg/min via INTRAVENOUS
  Administered 2018-01-04: 20 ug/kg/min via INTRAVENOUS
  Administered 2018-01-04: 40 ug/kg/min via INTRAVENOUS
  Administered 2018-01-04: 20 ug/kg/min via INTRAVENOUS
  Administered 2018-01-04: 40 ug/kg/min via INTRAVENOUS
  Administered 2018-01-05: 30 ug/kg/min via INTRAVENOUS
  Administered 2018-01-05 (×3): 40 ug/kg/min via INTRAVENOUS
  Administered 2018-01-05: 30 ug/kg/min via INTRAVENOUS
  Administered 2018-01-06 (×2): 40 ug/kg/min via INTRAVENOUS
  Administered 2018-01-06 – 2018-01-07 (×4): 30 ug/kg/min via INTRAVENOUS
  Filled 2017-12-29 (×46): qty 100

## 2017-12-29 MED ORDER — FENTANYL CITRATE (PF) 100 MCG/2ML IJ SOLN: 50.0000 ug | Freq: Once | INTRAMUSCULAR | Status: DC

## 2017-12-29 MED ORDER — LACTATED RINGERS IV SOLN
INTRAVENOUS | Status: DC | PRN
  Administered 2017-12-29: 03:00:00 via INTRAVENOUS

## 2017-12-29 MED ORDER — LIDOCAINE HCL (CARDIAC) PF 100 MG/5ML IV SOSY
PREFILLED_SYRINGE | INTRAVENOUS | Status: DC | PRN
  Administered 2017-12-29: 60 mg via INTRAVENOUS

## 2017-12-29 MED ORDER — 0.9 % SODIUM CHLORIDE (POUR BTL) OPTIME
TOPICAL | Status: DC | PRN
  Administered 2017-12-29: 3000 mL

## 2017-12-29 MED ORDER — PROPOFOL 10 MG/ML IV BOLUS
INTRAVENOUS | Status: AC
  Filled 2017-12-29: qty 20

## 2017-12-29 MED ORDER — SODIUM CHLORIDE 0.9 % IV SOLN
INTRAVENOUS | Status: DC | PRN
  Administered 2017-12-29: 04:00:00 via INTRAVENOUS

## 2017-12-29 MED ORDER — PROPOFOL 10 MG/ML IV BOLUS
INTRAVENOUS | Status: DC | PRN
  Administered 2017-12-29: 100 mg via INTRAVENOUS

## 2017-12-29 MED ORDER — PIPERACILLIN-TAZOBACTAM 3.375 G IVPB 30 MIN
3.3750 g | Freq: Once | INTRAVENOUS | Status: AC
  Administered 2017-12-29: 3.375 g via INTRAVENOUS
  Filled 2017-12-29: qty 50

## 2017-12-29 MED ORDER — FENTANYL 2500MCG IN NS 250ML (10MCG/ML) PREMIX INFUSION
25.0000 ug/h | INTRAVENOUS | Status: DC
  Administered 2017-12-29: 400 ug/h via INTRAVENOUS
  Administered 2017-12-29: 300 ug/h via INTRAVENOUS
  Administered 2017-12-29: 50 ug/h via INTRAVENOUS
  Administered 2017-12-30 (×4): 400 ug/h via INTRAVENOUS
  Administered 2017-12-31: 350 ug/h via INTRAVENOUS
  Administered 2017-12-31: 400 ug/h via INTRAVENOUS
  Administered 2017-12-31: 375 ug/h via INTRAVENOUS
  Administered 2017-12-31: 400 ug/h via INTRAVENOUS
  Administered 2018-01-01: 350 ug/h via INTRAVENOUS
  Administered 2018-01-01: 375 ug/h via INTRAVENOUS
  Administered 2018-01-01 – 2018-01-02 (×3): 350 ug/h via INTRAVENOUS
  Administered 2018-01-02: 300 ug/h via INTRAVENOUS
  Administered 2018-01-02 – 2018-01-03 (×2): 350 ug/h via INTRAVENOUS
  Administered 2018-01-03 – 2018-01-04 (×4): 200 ug/h via INTRAVENOUS
  Administered 2018-01-05 – 2018-01-07 (×4): 175 ug/h via INTRAVENOUS
  Filled 2017-12-29 (×27): qty 250

## 2017-12-29 MED ORDER — PIPERACILLIN-TAZOBACTAM 3.375 G IVPB
3.3750 g | Freq: Three times a day (TID) | INTRAVENOUS | Status: DC
  Administered 2017-12-29 – 2017-12-31 (×6): 3.375 g via INTRAVENOUS
  Filled 2017-12-29 (×7): qty 50

## 2017-12-29 MED ORDER — CHLORHEXIDINE GLUCONATE 0.12% ORAL RINSE (MEDLINE KIT)
15.0000 mL | Freq: Two times a day (BID) | OROMUCOSAL | Status: DC
  Administered 2017-12-29 – 2018-01-14 (×30): 15 mL via OROMUCOSAL

## 2017-12-29 MED ORDER — FENTANYL CITRATE (PF) 100 MCG/2ML IJ SOLN
INTRAMUSCULAR | Status: DC | PRN
  Administered 2017-12-29: 100 ug via INTRAVENOUS
  Administered 2017-12-29: 150 ug via INTRAVENOUS

## 2017-12-29 MED ORDER — ORAL CARE MOUTH RINSE
15.0000 mL | OROMUCOSAL | Status: DC
  Administered 2017-12-29 – 2018-01-07 (×89): 15 mL via OROMUCOSAL

## 2017-12-29 MED ORDER — SODIUM CHLORIDE 0.9 % IV SOLN
INTRAVENOUS | Status: DC
  Administered 2017-12-29 – 2018-01-05 (×14): via INTRAVENOUS
  Administered 2018-01-06 (×2): 1000 mL via INTRAVENOUS
  Administered 2018-01-07 – 2018-01-11 (×7): via INTRAVENOUS

## 2017-12-29 MED ORDER — MIDAZOLAM HCL 5 MG/5ML IJ SOLN
INTRAMUSCULAR | Status: DC | PRN
Start: 2017-12-29 — End: 2017-12-29
  Administered 2017-12-29 (×2): 2 mg via INTRAVENOUS

## 2017-12-29 MED ORDER — PROPOFOL 10 MG/ML IV BOLUS
INTRAVENOUS | Status: DC | PRN
  Administered 2017-12-29: 110 mg via INTRAVENOUS

## 2017-12-29 MED ORDER — PANTOPRAZOLE SODIUM 40 MG PO TBEC: 40.0000 mg | DELAYED_RELEASE_TABLET | Freq: Two times a day (BID) | ORAL | Status: DC

## 2017-12-29 MED ORDER — ROCURONIUM BROMIDE 100 MG/10ML IV SOLN
INTRAVENOUS | Status: DC | PRN
Start: 2017-12-29 — End: 2017-12-29
  Administered 2017-12-29 (×2): 50 mg via INTRAVENOUS

## 2017-12-29 MED ORDER — FAMOTIDINE IN NACL 20-0.9 MG/50ML-% IV SOLN
20.0000 mg | Freq: Two times a day (BID) | INTRAVENOUS | Status: DC
  Administered 2017-12-29 – 2018-01-13 (×30): 20 mg via INTRAVENOUS
  Filled 2017-12-29 (×31): qty 50

## 2017-12-29 MED ORDER — FENTANYL CITRATE (PF) 100 MCG/2ML IJ SOLN
INTRAMUSCULAR | Status: AC
  Filled 2017-12-29: qty 2

## 2017-12-29 MED ORDER — ALBUMIN HUMAN 5 % IV SOLN
INTRAVENOUS | Status: DC | PRN
  Administered 2017-12-29: 03:00:00 via INTRAVENOUS

## 2017-12-29 MED ORDER — FENTANYL CITRATE (PF) 250 MCG/5ML IJ SOLN
INTRAMUSCULAR | Status: AC
  Filled 2017-12-29: qty 5

## 2017-12-29 MED ORDER — MIDAZOLAM HCL 2 MG/2ML IJ SOLN
2.0000 mg | INTRAMUSCULAR | Status: DC | PRN
  Administered 2017-12-29 – 2018-01-07 (×6): 2 mg via INTRAVENOUS
  Filled 2017-12-29 (×7): qty 2

## 2017-12-29 MED ORDER — HEMOSTATIC AGENTS (NO CHARGE) OPTIME
TOPICAL | Status: DC | PRN
  Administered 2017-12-29: 1 via TOPICAL

## 2017-12-29 MED ORDER — SODIUM BICARBONATE 8.4 % IV SOLN
INTRAVENOUS | Status: DC | PRN
  Administered 2017-12-29: 100 meq via INTRAVENOUS

## 2017-12-29 SURGICAL SUPPLY — 45 items
BANDAGE ACE 4X5 VEL STRL LF (GAUZE/BANDAGES/DRESSINGS) ×3 IMPLANT
BLADE CLIPPER SURG (BLADE) ×3 IMPLANT
BNDG GAUZE ELAST 4 BULKY (GAUZE/BANDAGES/DRESSINGS) ×3 IMPLANT
CANISTER SUCT 3000ML PPV (MISCELLANEOUS) ×6 IMPLANT
CHLORAPREP W/TINT 26ML (MISCELLANEOUS) IMPLANT
COVER SURGICAL LIGHT HANDLE (MISCELLANEOUS) ×3 IMPLANT
DRAPE LAPAROSCOPIC ABDOMINAL (DRAPES) ×3 IMPLANT
DRAPE WARM FLUID 44X44 (DRAPE) ×3 IMPLANT
DRSG OPSITE POSTOP 4X10 (GAUZE/BANDAGES/DRESSINGS) IMPLANT
DRSG OPSITE POSTOP 4X8 (GAUZE/BANDAGES/DRESSINGS) IMPLANT
DRSG VAC ATS LRG SENSATRAC (GAUZE/BANDAGES/DRESSINGS) IMPLANT
ELECT BLADE 6.5 EXT (BLADE) ×3 IMPLANT
ELECT CAUTERY BLADE 6.4 (BLADE) ×3 IMPLANT
ELECT REM PT RETURN 9FT ADLT (ELECTROSURGICAL) ×3
ELECTRODE REM PT RTRN 9FT ADLT (ELECTROSURGICAL) ×1 IMPLANT
GAUZE SPONGE 4X4 12PLY STRL (GAUZE/BANDAGES/DRESSINGS) ×6 IMPLANT
GLOVE BIO SURGEON STRL SZ7 (GLOVE) ×3 IMPLANT
GLOVE BIOGEL PI IND STRL 7.5 (GLOVE) ×1 IMPLANT
GLOVE BIOGEL PI INDICATOR 7.5 (GLOVE) ×2
GOWN STRL REUS W/ TWL LRG LVL3 (GOWN DISPOSABLE) ×2 IMPLANT
GOWN STRL REUS W/TWL LRG LVL3 (GOWN DISPOSABLE) ×4
KIT BASIN OR (CUSTOM PROCEDURE TRAY) ×3 IMPLANT
KIT TURNOVER KIT B (KITS) ×3 IMPLANT
LIGASURE IMPACT 36 18CM CVD LR (INSTRUMENTS) ×3 IMPLANT
NS IRRIG 1000ML POUR BTL (IV SOLUTION) ×15 IMPLANT
PACK GENERAL/GYN (CUSTOM PROCEDURE TRAY) ×3 IMPLANT
PAD ARMBOARD 7.5X6 YLW CONV (MISCELLANEOUS) ×3 IMPLANT
RELOAD PROXIMATE 75MM BLUE (ENDOMECHANICALS) ×15 IMPLANT
SPECIMEN JAR LARGE (MISCELLANEOUS) ×3 IMPLANT
SPONGE ABDOMINAL VAC ABTHERA (MISCELLANEOUS) ×3 IMPLANT
SPONGE LAP 18X18 X RAY DECT (DISPOSABLE) ×6 IMPLANT
STAPLER PROXIMATE 75MM BLUE (STAPLE) ×3 IMPLANT
STAPLER VISISTAT 35W (STAPLE) IMPLANT
SUCTION POOLE TIP (SUCTIONS) ×3 IMPLANT
SUT PDS AB 1 TP1 96 (SUTURE) IMPLANT
SUT PROLENE 2 0 CT2 30 (SUTURE) ×3 IMPLANT
SUT SILK 2 0 SH CR/8 (SUTURE) ×3 IMPLANT
SUT SILK 2 0 TIES 10X30 (SUTURE) ×3 IMPLANT
SUT SILK 3 0 SH CR/8 (SUTURE) ×3 IMPLANT
SUT SILK 3 0 TIES 10X30 (SUTURE) ×3 IMPLANT
SUT VIC AB 3-0 SH 18 (SUTURE) IMPLANT
TAPE CLOTH SURG 4X10 WHT LF (GAUZE/BANDAGES/DRESSINGS) IMPLANT
TOWEL OR 17X26 10 PK STRL BLUE (TOWEL DISPOSABLE) ×3 IMPLANT
TRAY FOLEY MTR SLVR 16FR STAT (SET/KITS/TRAYS/PACK) ×3 IMPLANT
YANKAUER SUCT BULB TIP NO VENT (SUCTIONS) IMPLANT

## 2017-12-29 NOTE — Anesthesia Procedure Notes (Signed)
Arterial Line Insertion Start/End6/15/2019 3:20 AM, 12/29/2017 3:20 AM Performed by: CRNA  Patient location: Pre-op. Preanesthetic checklist: patient identified, IV checked, site marked, risks and benefits discussed, surgical consent, monitors and equipment checked, pre-op evaluation, timeout performed and anesthesia consent Lidocaine 1% used for infiltration Left, radial was placed Catheter size: 20 Fr Hand hygiene performed  and maximum sterile barriers used   Attempts: 1 Procedure performed without using ultrasound guided technique. Following insertion, dressing applied. Post procedure assessment: normal and unchanged
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
## 2017-12-29 NOTE — ED Notes (Signed)
No family present  Pt not giving oout numbers

## 2017-12-29 NOTE — Transfer of Care (Signed)
Immediate Anesthesia Transfer of Care Note  Patient: [name]  Procedure(s) Performed: AN AD HOC INTUBATION  Patient Location: ICU  Anesthesia Type:General  Level of Consciousness: sedated  Airway & Oxygen Therapy: Patient placed on Ventilator (see vital sign flow sheet for setting)  Post-op Assessment: Report given to RN and Post -op Vital signs reviewed and stable  Post vital signs: Reviewed and stable  Last Vitals:  Vitals Value Taken Time  BP 102/63 12/29/2017  6:30 PM  Temp    Pulse 112 12/29/2017  6:34 PM  Resp 15 12/29/2017  6:34 PM  SpO2 96 % 12/29/2017  6:34 PM  Vitals shown include unvalidated device data.  Last Pain:  Vitals:   12/29/17 1600  TempSrc: Axillary  PainSc:          Complications: No apparent anesthesia complications
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
## 2017-12-29 NOTE — ED Notes (Signed)
To or now 

## 2017-12-29 NOTE — Anesthesia Procedure Notes (Signed)
Procedure Name: Intubation Date/Time: 12/29/2017 3:20 AM Performed by: Babs Bertin, CRNA Pre-anesthesia Checklist: Patient identified, Emergency Drugs available, Suction available and Patient being monitored Patient Re-evaluated:Patient Re-evaluated prior to induction Oxygen Delivery Method: Circle System Utilized Preoxygenation: Pre-oxygenation with 100% oxygen Induction Type: IV induction, Rapid sequence and Cricoid Pressure applied Laryngoscope Size: Mac and 4 Grade View: Grade II Tube type: Subglottic suction tube Tube size: 7.5 mm Number of attempts: 1 Airway Equipment and Method: Stylet and Oral airway Placement Confirmation: ETT inserted through vocal cords under direct vision,  positive ETCO2 and breath sounds checked- equal and bilateral Secured at: 22 cm Tube secured with: Tape Dental Injury: Teeth and Oropharynx as per pre-operative assessment

## 2017-12-29 NOTE — Transfer of Care (Signed)
Immediate Anesthesia Transfer of Care Note  Patient: [name]  Procedure(s) Performed: EXPLORATORY LAPAROTOMY, SMALL BOWEL RESECTION TIMES TWO, SIGMOID COLECTOMY (N/A Abdomen)  Patient Location: PACU  Anesthesia Type:General  Level of Consciousness: Patient remains intubated per anesthesia plan  Airway & Oxygen Therapy: Patient remains intubated per anesthesia plan and Patient placed on Ventilator (see vital sign flow sheet for setting)  Post-op Assessment: Report given to RN and Post -op Vital signs reviewed and stable  Post vital signs: Reviewed and stable  Last Vitals:  Vitals Value Taken Time  BP    Temp    Pulse 95 12/29/2017  4:51 AM  Resp 17 12/29/2017  4:51 AM  SpO2 99 % 12/29/2017  4:51 AM  Vitals shown include unvalidated device data.  Last Pain:  Vitals:   12/29/17 0321  PainSc: 10-Worst pain ever         Complications: No apparent anesthesia complications

## 2017-12-29 NOTE — ED Notes (Signed)
Clothes all cut off by es and placed in a bag  2 shirts pants  One watch chapstick and 50 cents 2 quarters.   Clothes to be taken to the pts to the pts room after or.  Valuables p[laced in security
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
## 2017-12-29 NOTE — Anesthesia Preprocedure Evaluation (Signed)
Anesthesia Evaluation  Patient identified by MRN, date of birth, ID bandPreop documentation limited or incomplete due to emergent nature of procedure.  Airway Mallampati: II  TM Distance: >3 FB     Dental   Pulmonary Current Smoker,    breath sounds clear to auscultation       Cardiovascular  Rhythm:Regular Rate:Normal     Neuro/Psych    GI/Hepatic   Endo/Other    Renal/GU      Musculoskeletal   Abdominal   Peds  Hematology   Anesthesia Other Findings   Reproductive/Obstetrics                             Anesthesia Physical Anesthesia Plan  ASA: II  Anesthesia Plan: General   Post-op Pain Management:    Induction: Intravenous  PONV Risk Score and Plan: Treatment may vary due to age or medical condition, Ondansetron, Dexamethasone and Midazolam  Airway Management Planned: Oral ETT  Additional Equipment:   Intra-op Plan:   Post-operative Plan: Possible Post-op intubation/ventilation  Informed Consent:   Plan Discussed with: CRNA, Anesthesiologist and Surgeon  Anesthesia Plan Comments:         Anesthesia Quick Evaluation
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
## 2017-12-29 NOTE — Progress Notes (Signed)
RT called to the bedside for cuff leak. Cuff Pressure 24. Pt was receiving same Vt insp and Expiratory documented on flowsheet
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
## 2017-12-29 NOTE — Anesthesia Postprocedure Evaluation (Signed)
Anesthesia Post Note  Patient: [name]  Procedure(s) Performed: AN AD HOC INTUBATION     Anesthesia Post Evaluation  Last Vitals:  Vitals:   12/29/17 1730 12/29/17 1823  BP: 128/72 (!) 114/51  Pulse: (!) 110 (!) 117  Resp: 13 14  Temp:    SpO2: 97% 97%    Last Pain:  Vitals:   12/29/17 1600  TempSrc: Axillary  PainSc:                  Jamieka Royle

## 2017-12-29 NOTE — ED Notes (Signed)
Zosyn 3.375gms  Sent to or with the pt

## 2017-12-29 NOTE — ED Notes (Signed)
Portable chest and abd xrays

## 2017-12-30 ENCOUNTER — Inpatient Hospital Stay (HOSPITAL_COMMUNITY): Payer: Self-pay

## 2017-12-30 ENCOUNTER — Other Ambulatory Visit: Payer: Self-pay

## 2017-12-30 LAB — CBC
HCT: 31.3 % — ABNORMAL LOW (ref 39.0–52.0)
Hemoglobin: 9.6 g/dL — ABNORMAL LOW (ref 13.0–17.0)
MCH: 26.3 pg (ref 26.0–34.0)
MCHC: 30.7 g/dL (ref 30.0–36.0)
MCV: 85.8 fL (ref 78.0–100.0)
PLATELETS: 179 10*3/uL (ref 150–400)
RBC: 3.65 MIL/uL — ABNORMAL LOW (ref 4.22–5.81)
RDW: 14.3 % (ref 11.5–15.5)
WBC: 12.2 10*3/uL — ABNORMAL HIGH (ref 4.0–10.5)

## 2017-12-30 LAB — BASIC METABOLIC PANEL
Anion gap: 5 (ref 5–15)
BUN: 9 mg/dL (ref 6–20)
CO2: 29 mmol/L (ref 22–32)
CREATININE: 1.21 mg/dL (ref 0.61–1.24)
Calcium: 7.9 mg/dL — ABNORMAL LOW (ref 8.9–10.3)
Chloride: 103 mmol/L (ref 101–111)
Glucose, Bld: 113 mg/dL — ABNORMAL HIGH (ref 65–99)
Potassium: 4.5 mmol/L (ref 3.5–5.1)
SODIUM: 137 mmol/L (ref 135–145)

## 2017-12-30 LAB — MAGNESIUM: MAGNESIUM: 2 mg/dL (ref 1.7–2.4)

## 2017-12-30 LAB — SURGICAL PCR SCREEN
MRSA, PCR: NEGATIVE
Staphylococcus aureus: NEGATIVE

## 2017-12-30 LAB — GLUCOSE, CAPILLARY: GLUCOSE-CAPILLARY: 105 mg/dL — AB (ref 65–99)

## 2017-12-30 MED ORDER — MUPIROCIN 2 % EX OINT
1.0000 "application " | TOPICAL_OINTMENT | Freq: Two times a day (BID) | CUTANEOUS | Status: AC
  Administered 2017-12-30 – 2018-01-01 (×5): 1 via NASAL
  Filled 2017-12-30: qty 22
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
## 2017-12-30 NOTE — Anesthesia Postprocedure Evaluation (Signed)
Anesthesia Post Note  Patient: [name]  Procedure(s) Performed: EXPLORATORY LAPAROTOMY, SMALL BOWEL RESECTION TIMES TWO, SIGMOID COLECTOMY (N/A Abdomen)     Patient location during evaluation: ICU Anesthesia Type: General Level of consciousness: patient remains intubated per anesthesia plan Pain management: pain level controlled Vital Signs Assessment: post-procedure vital signs reviewed and stable Respiratory status: patient remains intubated per anesthesia plan Cardiovascular status: stable Postop Assessment: no apparent nausea or vomiting Anesthetic complications: no    Last Vitals:  Vitals:   12/30/17 0725 12/30/17 0800  BP: 99/61 (!) 89/58  Pulse:  91  Resp:  14  Temp:  37.8 C  SpO2: 97% 96%    Last Pain:  Vitals:   12/30/17 0800  TempSrc: Axillary  PainSc:                  [name]

## 2017-12-31 ENCOUNTER — Inpatient Hospital Stay (HOSPITAL_COMMUNITY): Payer: Self-pay | Admitting: Certified Registered"

## 2017-12-31 ENCOUNTER — Encounter (HOSPITAL_COMMUNITY): Admission: EM | Disposition: A | Discharge status: Rehabilitation Facility | Payer: Self-pay | Source: Home / Self Care

## 2017-12-31 ENCOUNTER — Encounter (HOSPITAL_COMMUNITY): Payer: Self-pay | Admitting: Surgery

## 2017-12-31 ENCOUNTER — Inpatient Hospital Stay (HOSPITAL_COMMUNITY): Payer: Self-pay

## 2017-12-31 HISTORY — PX: LAPAROTOMY: SHX154

## 2017-12-31 HISTORY — PX: COLOSTOMY: SHX63

## 2017-12-31 LAB — CBC
HCT: 28 % — ABNORMAL LOW (ref 39.0–52.0)
HEMOGLOBIN: 8.5 g/dL — AB (ref 13.0–17.0)
MCH: 26.8 pg (ref 26.0–34.0)
MCHC: 30.4 g/dL (ref 30.0–36.0)
MCV: 88.3 fL (ref 78.0–100.0)
Platelets: 165 10*3/uL (ref 150–400)
RBC: 3.17 MIL/uL — AB (ref 4.22–5.81)
RDW: 14 % (ref 11.5–15.5)
WBC: 10.8 10*3/uL — AB (ref 4.0–10.5)

## 2017-12-31 LAB — PROTIME-INR
INR: 1.13
PROTHROMBIN TIME: 14.4 s (ref 11.4–15.2)

## 2017-12-31 LAB — GLUCOSE, CAPILLARY: GLUCOSE-CAPILLARY: 115 mg/dL — AB (ref 65–99)

## 2017-12-31 LAB — BASIC METABOLIC PANEL
ANION GAP: 3 — AB (ref 5–15)
BUN: 5 mg/dL — ABNORMAL LOW (ref 6–20)
CO2: 30 mmol/L (ref 22–32)
CREATININE: 1.15 mg/dL (ref 0.61–1.24)
Calcium: 7.7 mg/dL — ABNORMAL LOW (ref 8.9–10.3)
Chloride: 105 mmol/L (ref 101–111)
GFR calc non Af Amer: >60 mL/min (ref >60)
Glucose, Bld: 91 mg/dL (ref 65–99)
POTASSIUM: 4 mmol/L (ref 3.5–5.1)
SODIUM: 138 mmol/L (ref 135–145)

## 2017-12-31 LAB — PREPARE RBC (CROSSMATCH)

## 2017-12-31 LAB — BLOOD PRODUCT ORDER (VERBAL) VERIFICATION

## 2017-12-31 LAB — LACTIC ACID, PLASMA: LACTIC ACID, VENOUS: 0.7 mmol/L (ref 0.5–1.9)

## 2017-12-31 SURGERY — LAPAROTOMY, EXPLORATORY
Anesthesia: General | Site: Abdomen

## 2017-12-31 MED ORDER — EPHEDRINE SULFATE 50 MG/ML IJ SOLN
INTRAMUSCULAR | Status: AC
  Filled 2017-12-31: qty 1

## 2017-12-31 MED ORDER — SODIUM CHLORIDE 0.9 % IV SOLN
Freq: Once | INTRAVENOUS | Status: AC
  Administered 2018-01-10: 09:00:00 via INTRAVENOUS

## 2017-12-31 MED ORDER — FENTANYL CITRATE (PF) 250 MCG/5ML IJ SOLN
INTRAMUSCULAR | Status: AC
  Filled 2017-12-31: qty 5

## 2017-12-31 MED ORDER — METOPROLOL TARTRATE 5 MG/5ML IV SOLN
5.0000 mg | Freq: Four times a day (QID) | INTRAVENOUS | Status: AC
Start: 2017-12-31 — End: 2017-12-31
  Administered 2017-12-31: 5 mg via INTRAVENOUS
  Filled 2017-12-31: qty 5

## 2017-12-31 MED ORDER — 0.9 % SODIUM CHLORIDE (POUR BTL) OPTIME
TOPICAL | Status: DC | PRN
  Administered 2017-12-31 (×3): 1000 mL

## 2017-12-31 MED ORDER — LACTATED RINGERS IV SOLN
INTRAVENOUS | Status: DC | PRN
  Administered 2017-12-31: 08:00:00 via INTRAVENOUS

## 2017-12-31 MED ORDER — PROPOFOL 10 MG/ML IV BOLUS
INTRAVENOUS | Status: AC
  Filled 2017-12-31: qty 20

## 2017-12-31 MED ORDER — SODIUM CHLORIDE 0.9 % IJ SOLN
INTRAMUSCULAR | Status: AC
  Filled 2017-12-31: qty 10

## 2017-12-31 MED ORDER — MIDAZOLAM HCL 2 MG/2ML IJ SOLN
INTRAMUSCULAR | Status: DC | PRN
  Administered 2017-12-31: 2 mg via INTRAVENOUS

## 2017-12-31 MED ORDER — PROPOFOL 1000 MG/100ML IV EMUL
INTRAVENOUS | Status: AC
  Filled 2017-12-31: qty 100

## 2017-12-31 MED ORDER — ROCURONIUM BROMIDE 10 MG/ML (PF) SYRINGE
PREFILLED_SYRINGE | INTRAVENOUS | Status: DC | PRN
  Administered 2017-12-31: 20 mg via INTRAVENOUS
  Administered 2017-12-31: 50 mg via INTRAVENOUS
  Administered 2017-12-31: 20 mg via INTRAVENOUS
  Administered 2017-12-31: 10 mg via INTRAVENOUS

## 2017-12-31 MED ORDER — MIDAZOLAM HCL 2 MG/2ML IJ SOLN
INTRAMUSCULAR | Status: AC
  Filled 2017-12-31: qty 2

## 2017-12-31 MED ORDER — ROCURONIUM BROMIDE 50 MG/5ML IV SOLN
INTRAVENOUS | Status: AC
  Filled 2017-12-31: qty 2

## 2017-12-31 SURGICAL SUPPLY — 47 items
BNDG GAUZE ELAST 4 BULKY (GAUZE/BANDAGES/DRESSINGS) ×4 IMPLANT
CANISTER SUCT 3000ML PPV (MISCELLANEOUS) ×4 IMPLANT
COVER SURGICAL LIGHT HANDLE (MISCELLANEOUS) ×8 IMPLANT
DRAPE LAPAROSCOPIC ABDOMINAL (DRAPES) ×4 IMPLANT
DRAPE WARM FLUID 44X44 (DRAPE) ×4 IMPLANT
DRSG OPSITE POSTOP 4X10 (GAUZE/BANDAGES/DRESSINGS) IMPLANT
DRSG OPSITE POSTOP 4X8 (GAUZE/BANDAGES/DRESSINGS) IMPLANT
ELECT BLADE 6.5 EXT (BLADE) ×4 IMPLANT
ELECT CAUTERY BLADE 6.4 (BLADE) ×8 IMPLANT
ELECT REM PT RETURN 9FT ADLT (ELECTROSURGICAL) ×4
ELECTRODE REM PT RTRN 9FT ADLT (ELECTROSURGICAL) ×2 IMPLANT
GLOVE BIO SURGEON STRL SZ 6.5 (GLOVE) ×3 IMPLANT
GLOVE BIO SURGEON STRL SZ8 (GLOVE) ×8 IMPLANT
GLOVE BIO SURGEONS STRL SZ 6.5 (GLOVE) ×1
GLOVE BIOGEL PI IND STRL 7.0 (GLOVE) ×10 IMPLANT
GLOVE BIOGEL PI IND STRL 8 (GLOVE) ×4 IMPLANT
GLOVE BIOGEL PI INDICATOR 7.0 (GLOVE) ×10
GLOVE BIOGEL PI INDICATOR 8 (GLOVE) ×4
GLOVE SURG SS PI 6.5 STRL IVOR (GLOVE) ×4 IMPLANT
GOWN STRL REUS W/ TWL LRG LVL3 (GOWN DISPOSABLE) ×6 IMPLANT
GOWN STRL REUS W/ TWL XL LVL3 (GOWN DISPOSABLE) ×2 IMPLANT
GOWN STRL REUS W/TWL LRG LVL3 (GOWN DISPOSABLE) ×6
GOWN STRL REUS W/TWL XL LVL3 (GOWN DISPOSABLE) ×2
KIT BASIN OR (CUSTOM PROCEDURE TRAY) ×4 IMPLANT
KIT OSTOMY DRAINABLE 2.75 STR (WOUND CARE) ×4 IMPLANT
KIT TURNOVER KIT B (KITS) ×4 IMPLANT
LIGASURE IMPACT 36 18CM CVD LR (INSTRUMENTS) ×4 IMPLANT
NS IRRIG 1000ML POUR BTL (IV SOLUTION) ×8 IMPLANT
PACK GENERAL/GYN (CUSTOM PROCEDURE TRAY) ×4 IMPLANT
PAD ABD 8X10 STRL (GAUZE/BANDAGES/DRESSINGS) ×8 IMPLANT
PAD ARMBOARD 7.5X6 YLW CONV (MISCELLANEOUS) ×4 IMPLANT
PENCIL BUTTON HOLSTER BLD 10FT (ELECTRODE) ×4 IMPLANT
RELOAD PROXIMATE 75MM BLUE (ENDOMECHANICALS) ×4 IMPLANT
RELOAD PROXIMATE TA60MM BLUE (ENDOMECHANICALS) ×4 IMPLANT
SPECIMEN JAR LARGE (MISCELLANEOUS) IMPLANT
STAPLER GUN LINEAR PROX 60 (STAPLE) ×4 IMPLANT
STAPLER PROXIMATE 75MM BLUE (STAPLE) ×4 IMPLANT
STAPLER VISISTAT 35W (STAPLE) ×4 IMPLANT
SUCTION POOLE TIP (SUCTIONS) ×4 IMPLANT
SUT PDS AB 1 TP1 96 (SUTURE) ×8 IMPLANT
SUT SILK 2 0 SH CR/8 (SUTURE) ×4 IMPLANT
SUT SILK 2 0 TIES 10X30 (SUTURE) ×4 IMPLANT
SUT SILK 3 0 SH CR/8 (SUTURE) ×8 IMPLANT
SUT SILK 3 0 TIES 10X30 (SUTURE) ×4 IMPLANT
SUT VIC AB 3-0 SH 8-18 (SUTURE) ×4 IMPLANT
TAPE CLOTH SURG 6X10 WHT LF (GAUZE/BANDAGES/DRESSINGS) ×4 IMPLANT
YANKAUER SUCT BULB TIP NO VENT (SUCTIONS) ×4 IMPLANT

## 2017-12-31 NOTE — Transfer of Care (Signed)
Immediate Anesthesia Transfer of Care Note  Patient: [name]  Procedure(s) Performed: EXPLORATORY LAPAROTOMY (N/A Abdomen) COLOSTOMY (Left Abdomen)  Patient Location: ICU  Anesthesia Type:General  Level of Consciousness: Patient remains intubated per anesthesia plan  Airway & Oxygen Therapy: Patient remains intubated per anesthesia plan and Patient placed on Ventilator (see vital sign flow sheet for setting)  Post-op Assessment: Report given to RN and Post -op Vital signs reviewed and stable  Post vital signs: Reviewed and stable  Last Vitals:  Vitals Value Taken Time  BP    Temp    Pulse    Resp    SpO2      Last Pain:  Vitals:   12/31/17 0400  TempSrc: Axillary  PainSc:          Complications: No apparent anesthesia complications

## 2017-12-31 NOTE — Anesthesia Postprocedure Evaluation (Signed)
Anesthesia Post Note  Patient: Daine Gravel  Procedure(s) Performed: EXPLORATORY LAPAROTOMY (N/A Abdomen) COLOSTOMY (Left Abdomen)     Patient location during evaluation: SICU Anesthesia Type: General Level of consciousness: sedated and patient remains intubated per anesthesia plan Pain management: pain level controlled Vital Signs Assessment: post-procedure vital signs reviewed and stable Respiratory status: patient remains intubated per anesthesia plan Cardiovascular status: stable Postop Assessment: no apparent nausea or vomiting Anesthetic complications: no    Last Vitals:  Vitals:   12/31/17 1200 12/31/17 1300  BP: (!) 147/94 136/82  Pulse: (!) 118 95  Resp: 15 13  Temp: 37.2 C   SpO2: 92% 96%    Last Pain:  Vitals:   12/31/17 1200  TempSrc: Axillary  PainSc:                  Montez Hageman
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
## 2017-12-31 NOTE — Anesthesia Preprocedure Evaluation (Signed)
Anesthesia Evaluation  Patient identified by MRN, date of birth, ID band Patient awake    Reviewed: Allergy & Precautions, NPO status , Patient's Chart, lab work & pertinent test results  Airway Mallampati: Intubated       Dental no notable dental hx.    Pulmonary neg pulmonary ROS, Current Smoker,    Pulmonary exam normal breath sounds clear to auscultation       Cardiovascular negative cardio ROS Normal cardiovascular exam Rhythm:Regular Rate:Normal     Neuro/Psych negative neurological ROS  negative psych ROS   GI/Hepatic negative GI ROS, Neg liver ROS,   Endo/Other  negative endocrine ROS  Renal/GU negative Renal ROS  negative genitourinary   Musculoskeletal negative musculoskeletal ROS (+)   Abdominal   Peds negative pediatric ROS (+)  Hematology negative hematology ROS (+)   Anesthesia Other Findings   Reproductive/Obstetrics negative OB ROS                             Anesthesia Physical Anesthesia Plan  ASA: II  Anesthesia Plan: General   Post-op Pain Management:    Induction: Intravenous  PONV Risk Score and Plan: Treatment may vary due to age or medical condition  Airway Management Planned: Oral ETT  Additional Equipment:   Intra-op Plan:   Post-operative Plan: Possible Post-op intubation/ventilation and Post-operative intubation/ventilation  Informed Consent: I have reviewed the patients History and Physical, chart, labs and discussed the procedure including the risks, benefits and alternatives for the proposed anesthesia with the patient or authorized representative who has indicated his/her understanding and acceptance.   Dental advisory given  Plan Discussed with: CRNA  Anesthesia Plan Comments:         Anesthesia Quick Evaluation
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
## 2017-12-31 NOTE — Plan of Care (Signed)
Patient was taken back to OR today to resect small bowel.  Hope tomorrow patient will be able to wean and potentially extubate if parameters are met.  Will discuss nutrition needs if patient is not able to be extubated.  Katherine Mantle RN

## 2018-01-01 ENCOUNTER — Inpatient Hospital Stay (HOSPITAL_COMMUNITY): Payer: Self-pay

## 2018-01-01 ENCOUNTER — Encounter (HOSPITAL_COMMUNITY): Payer: Self-pay

## 2018-01-01 ENCOUNTER — Other Ambulatory Visit: Payer: Self-pay

## 2018-01-01 LAB — TRIGLYCERIDES: Triglycerides: 285 mg/dL — ABNORMAL HIGH

## 2018-01-01 MED ORDER — FENTANYL BOLUS VIA INFUSION
50.0000 ug | INTRAVENOUS | Status: DC | PRN
  Administered 2018-01-01 – 2018-01-04 (×20): 100 ug via INTRAVENOUS
  Administered 2018-01-04: 50 ug via INTRAVENOUS
  Administered 2018-01-04 – 2018-01-07 (×4): 100 ug via INTRAVENOUS
  Filled 2018-01-01: qty 100

## 2018-01-01 MED ORDER — ACETYLCYSTEINE 10% NICU INHALATION SOLUTION
2.0000 mL | Freq: Three times a day (TID) | RESPIRATORY_TRACT | Status: DC
  Filled 2018-01-01 (×2): qty 2

## 2018-01-01 MED ORDER — ALBUTEROL SULFATE (2.5 MG/3ML) 0.083% IN NEBU
2.5000 mg | INHALATION_SOLUTION | RESPIRATORY_TRACT | Status: DC | PRN
  Administered 2018-01-01 – 2018-01-07 (×16): 2.5 mg via RESPIRATORY_TRACT
  Filled 2018-01-01 (×16): qty 3

## 2018-01-01 MED ORDER — ACETYLCYSTEINE 20 % IN SOLN
2.0000 mL | Freq: Three times a day (TID) | RESPIRATORY_TRACT | Status: DC
  Administered 2018-01-01 – 2018-01-02 (×4): 2 mL via RESPIRATORY_TRACT
  Filled 2018-01-01 (×4): qty 4

## 2018-01-01 NOTE — Consult Note (Addendum)
[facility] Nurse wound consult note Reason for Consult: Surgical team following for assessment and plan of care for abd wound.  Requested to apply Vac. Wound type: Full thickness post-op midline abd wound Measurement: 30X4.5X2cm Wound bed: beefy red Drainage (amount, consistency, odor) small amt pink drainage, no odor Periwound: intact Dressing procedure/placement/frequency: Pt medicated for pain prior to the procedure and tolerated with minimal amt discomfort.  He is intubated and sedated and there are no family members present to discuss plan of care.  Applied one piece of black foam to 168mm cont suction and pt tolerated with minimal amt discomfort.  WOC will plan to change again on Fri.  WOC Nurse ostomy consult note Stoma type/location:  Stoma is 90% red, 10% old bloody clots to edges Stomal assessment/size: 1 3/4 inches, slightly above skin level  Peristomal assessment:  Intact skin surrounding Treatment options for stomal/peristomal skin:  Current pouch was leaking behind the barrier.  Applied barrier ring to assist with maintaining a seal. Output: Small amt bloody drainage, no stool or flatus Ostomy pouching: 1pc. Education provided:  Pt is sedated and no family members are present.  Will begin teaching sessions when stable and out of ICU. Supplies ordered to the bedside for staff nurse use. Colostomy is located in close proximity to the abd Vac dressing; pouch change will need to be performed with each dressing change. Enrolled patient in [place] program: No  Julien Girt MSN, [place], [place], [place], [place]
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
## 2018-01-02 ENCOUNTER — Inpatient Hospital Stay (HOSPITAL_COMMUNITY): Payer: Self-pay

## 2018-01-02 LAB — BASIC METABOLIC PANEL
ANION GAP: 7 (ref 5–15)
BUN: 5 mg/dL — AB (ref 6–20)
CO2: 27 mmol/L (ref 22–32)
Calcium: 7.8 mg/dL — ABNORMAL LOW (ref 8.9–10.3)
Chloride: 104 mmol/L (ref 101–111)
Creatinine, Ser: 0.8 mg/dL (ref 0.61–1.24)
GFR calc Af Amer: >60 mL/min (ref >60)
Glucose, Bld: 93 mg/dL (ref 65–99)
POTASSIUM: 4.3 mmol/L (ref 3.5–5.1)
SODIUM: 138 mmol/L (ref 135–145)

## 2018-01-02 LAB — TYPE AND SCREEN
ABO/RH(D): O POS
ANTIBODY SCREEN: NEGATIVE
UNIT DIVISION: 0
UNIT DIVISION: 0
Unit division: 0
Unit division: 0

## 2018-01-02 LAB — CBC
HCT: 27.5 % — ABNORMAL LOW (ref 39.0–52.0)
Hemoglobin: 8.4 g/dL — ABNORMAL LOW (ref 13.0–17.0)
MCH: 26.8 pg (ref 26.0–34.0)
MCHC: 30.5 g/dL (ref 30.0–36.0)
MCV: 87.6 fL (ref 78.0–100.0)
PLATELETS: 194 10*3/uL (ref 150–400)
RBC: 3.14 MIL/uL — AB (ref 4.22–5.81)
RDW: 13.7 % (ref 11.5–15.5)
WBC: 14 10*3/uL — AB (ref 4.0–10.5)

## 2018-01-02 LAB — BPAM RBC
BLOOD PRODUCT EXPIRATION DATE: 201907052359
BLOOD PRODUCT EXPIRATION DATE: 201907142359
BLOOD PRODUCT EXPIRATION DATE: 201907142359
Blood Product Expiration Date: 201907052359
ISSUE DATE / TIME: 201906150236
ISSUE DATE / TIME: 201906150236
UNIT TYPE AND RH: 5100
UNIT TYPE AND RH: 9500
UNIT TYPE AND RH: 9500
Unit Type and Rh: 5100

## 2018-01-02 MED ORDER — ACETYLCYSTEINE 20 % IN SOLN
2.0000 mL | Freq: Three times a day (TID) | RESPIRATORY_TRACT | Status: DC
  Administered 2018-01-02: 4 mL via RESPIRATORY_TRACT
  Administered 2018-01-02: 2 mL via RESPIRATORY_TRACT
  Administered 2018-01-03 – 2018-01-04 (×5): 4 mL via RESPIRATORY_TRACT
  Administered 2018-01-04 – 2018-01-07 (×8): 2 mL via RESPIRATORY_TRACT
  Filled 2018-01-02 (×15): qty 4

## 2018-01-02 MED ORDER — SODIUM CHLORIDE 0.9 % IV SOLN
1.0000 g | Freq: Once | INTRAVENOUS | Status: AC
  Administered 2018-01-02: 1 g via INTRAVENOUS
  Filled 2018-01-02: qty 10
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
## 2018-01-03 ENCOUNTER — Inpatient Hospital Stay (HOSPITAL_COMMUNITY): Payer: Self-pay

## 2018-01-03 LAB — CBC
HEMATOCRIT: 24.6 % — AB (ref 39.0–52.0)
Hemoglobin: 7.6 g/dL — ABNORMAL LOW (ref 13.0–17.0)
MCH: 26.4 pg (ref 26.0–34.0)
MCHC: 30.9 g/dL (ref 30.0–36.0)
MCV: 85.4 fL (ref 78.0–100.0)
PLATELETS: 244 10*3/uL (ref 150–400)
RBC: 2.88 MIL/uL — ABNORMAL LOW (ref 4.22–5.81)
RDW: 13.5 % (ref 11.5–15.5)
WBC: 11.9 10*3/uL — ABNORMAL HIGH (ref 4.0–10.5)

## 2018-01-03 LAB — BASIC METABOLIC PANEL
Anion gap: 8 (ref 5–15)
BUN: <5 mg/dL — ABNORMAL LOW (ref 6–20)
CALCIUM: 8.1 mg/dL — AB (ref 8.9–10.3)
CO2: 30 mmol/L (ref 22–32)
CREATININE: 0.72 mg/dL (ref 0.61–1.24)
Chloride: 104 mmol/L (ref 101–111)
GFR calc Af Amer: >60 mL/min (ref >60)
GFR calc non Af Amer: >60 mL/min (ref >60)
GLUCOSE: 99 mg/dL (ref 65–99)
Potassium: 3.7 mmol/L (ref 3.5–5.1)
Sodium: 142 mmol/L (ref 135–145)

## 2018-01-03 MED ORDER — POTASSIUM CHLORIDE 20 MEQ/15ML (10%) PO SOLN
40.0000 meq | Freq: Two times a day (BID) | ORAL | Status: AC
  Administered 2018-01-03 (×2): 40 meq
  Filled 2018-01-03 (×2): qty 30

## 2018-01-03 MED ORDER — QUETIAPINE FUMARATE 100 MG PO TABS
100.0000 mg | ORAL_TABLET | Freq: Two times a day (BID) | ORAL | Status: DC
  Administered 2018-01-03 – 2018-01-08 (×12): 100 mg
  Filled 2018-01-03 (×12): qty 1

## 2018-01-03 MED ORDER — CLONAZEPAM 0.1 MG/ML ORAL SUSPENSION
1.0000 mg | Freq: Two times a day (BID) | ORAL | Status: DC
  Filled 2018-01-03: qty 10

## 2018-01-03 MED ORDER — CLONAZEPAM 0.5 MG PO TBDP
1.0000 mg | ORAL_TABLET | Freq: Two times a day (BID) | ORAL | Status: DC
  Administered 2018-01-03 (×2): 1 mg
  Filled 2018-01-03 (×3): qty 2

## 2018-01-03 MED ORDER — FUROSEMIDE 10 MG/ML IJ SOLN
40.0000 mg | Freq: Once | INTRAMUSCULAR | Status: AC
  Administered 2018-01-03: 40 mg via INTRAVENOUS
  Filled 2018-01-03: qty 4
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
## 2018-01-04 LAB — CBC
HEMATOCRIT: 28 % — AB (ref 39.0–52.0)
HEMOGLOBIN: 8.6 g/dL — AB (ref 13.0–17.0)
MCH: 26.1 pg (ref 26.0–34.0)
MCHC: 30.7 g/dL (ref 30.0–36.0)
MCV: 84.8 fL (ref 78.0–100.0)
Platelets: 337 10*3/uL (ref 150–400)
RBC: 3.3 MIL/uL — ABNORMAL LOW (ref 4.22–5.81)
RDW: 13.6 % (ref 11.5–15.5)
WBC: 14.9 10*3/uL — ABNORMAL HIGH (ref 4.0–10.5)

## 2018-01-04 LAB — TRIGLYCERIDES: Triglycerides: 226 mg/dL — ABNORMAL HIGH (ref <150)

## 2018-01-04 LAB — BASIC METABOLIC PANEL
ANION GAP: 8 (ref 5–15)
BUN: 7 mg/dL (ref 6–20)
CO2: 28 mmol/L (ref 22–32)
CREATININE: 0.73 mg/dL (ref 0.61–1.24)
Calcium: 8.5 mg/dL — ABNORMAL LOW (ref 8.9–10.3)
Chloride: 103 mmol/L (ref 101–111)
GFR calc non Af Amer: >60 mL/min (ref >60)
Glucose, Bld: 107 mg/dL — ABNORMAL HIGH (ref 65–99)
Potassium: 3.7 mmol/L (ref 3.5–5.1)
Sodium: 139 mmol/L (ref 135–145)

## 2018-01-04 LAB — GLUCOSE, CAPILLARY
GLUCOSE-CAPILLARY: 120 mg/dL — AB (ref 65–99)
Glucose-Capillary: 114 mg/dL — ABNORMAL HIGH (ref 65–99)
Glucose-Capillary: 109 mg/dL — ABNORMAL HIGH (ref 65–99)

## 2018-01-04 MED ORDER — SELENIUM 50 MCG PO TABS
200.0000 ug | ORAL_TABLET | Freq: Every day | ORAL | Status: DC
  Administered 2018-01-04: 200 ug
  Filled 2018-01-04 (×3): qty 4

## 2018-01-04 MED ORDER — VITAMIN C 500 MG PO TABS
1000.0000 mg | ORAL_TABLET | Freq: Three times a day (TID) | ORAL | Status: AC
  Administered 2018-01-04 – 2018-01-10 (×19): 1000 mg
  Filled 2018-01-04 (×19): qty 2

## 2018-01-04 MED ORDER — POTASSIUM CHLORIDE 20 MEQ/15ML (10%) PO SOLN
40.0000 meq | Freq: Two times a day (BID) | ORAL | Status: AC
  Administered 2018-01-04 (×2): 40 meq
  Filled 2018-01-04 (×2): qty 30

## 2018-01-04 MED ORDER — VITAL HIGH PROTEIN PO LIQD
1000.0000 mL | ORAL | Status: DC
  Administered 2018-01-04: 1000 mL

## 2018-01-04 MED ORDER — CLONAZEPAM 0.5 MG PO TBDP
2.0000 mg | ORAL_TABLET | Freq: Two times a day (BID) | ORAL | Status: DC
  Administered 2018-01-04 – 2018-01-06 (×6): 2 mg
  Filled 2018-01-04 (×6): qty 4
  Filled 2018-01-04: qty 16

## 2018-01-04 MED ORDER — FUROSEMIDE 10 MG/ML IJ SOLN
40.0000 mg | Freq: Once | INTRAMUSCULAR | Status: AC
  Administered 2018-01-04: 40 mg via INTRAVENOUS
  Filled 2018-01-04: qty 4

## 2018-01-04 MED ORDER — VITAMIN E 100 UNT/0.25ML PO OIL
400.0000 [IU] | TOPICAL_OIL | Freq: Three times a day (TID) | ORAL | Status: DC
  Administered 2018-01-04 – 2018-01-09 (×14): 400 [IU]
  Filled 2018-01-04 (×17): qty 1

## 2018-01-04 MED ORDER — PIVOT 1.5 CAL PO LIQD
1000.0000 mL | ORAL | Status: DC
  Administered 2018-01-04 – 2018-01-06 (×3): 1000 mL

## 2018-01-04 NOTE — Progress Notes (Signed)
RT note- RT was in another room with RN discussing care for another patient when we heard the ventilator alarm go off 3-4 times and rushed to solve the issue. Pt seemed tired and agitated on PSV/CPAP so RT switched him back to full support PRVC. Pt's family member seems frusterated because we are not extubating and RT has tried to explained weaning process.

## 2018-01-04 NOTE — Progress Notes (Signed)
RT note- Family refused chest pt due to pain. Pt is resting and RT will continue to monitor.
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
## 2018-01-05 LAB — BASIC METABOLIC PANEL
Anion gap: 7 (ref 5–15)
BUN: 12 mg/dL (ref 6–20)
CALCIUM: 8.6 mg/dL — AB (ref 8.9–10.3)
CO2: 28 mmol/L (ref 22–32)
Chloride: 107 mmol/L (ref 101–111)
Creatinine, Ser: 0.78 mg/dL (ref 0.61–1.24)
GFR calc Af Amer: >60 mL/min (ref >60)
GLUCOSE: 125 mg/dL — AB (ref 65–99)
Potassium: 3.6 mmol/L (ref 3.5–5.1)
Sodium: 142 mmol/L (ref 135–145)

## 2018-01-05 LAB — CBC
HCT: 26.8 % — ABNORMAL LOW (ref 39.0–52.0)
Hemoglobin: 8.4 g/dL — ABNORMAL LOW (ref 13.0–17.0)
MCH: 26.3 pg (ref 26.0–34.0)
MCHC: 31.3 g/dL (ref 30.0–36.0)
MCV: 84 fL (ref 78.0–100.0)
Platelets: 409 10*3/uL — ABNORMAL HIGH (ref 150–400)
RBC: 3.19 MIL/uL — ABNORMAL LOW (ref 4.22–5.81)
RDW: 13.8 % (ref 11.5–15.5)
WBC: 14.3 10*3/uL — AB (ref 4.0–10.5)

## 2018-01-05 LAB — GLUCOSE, CAPILLARY
GLUCOSE-CAPILLARY: 107 mg/dL — AB (ref 65–99)
GLUCOSE-CAPILLARY: 109 mg/dL — AB (ref 65–99)
Glucose-Capillary: 115 mg/dL — ABNORMAL HIGH (ref 65–99)
Glucose-Capillary: 112 mg/dL — ABNORMAL HIGH (ref 65–99)
Glucose-Capillary: 111 mg/dL — ABNORMAL HIGH (ref 65–99)

## 2018-01-05 MED ORDER — SELENIUM 200 MCG PO TABS
200.0000 ug | ORAL_TABLET | Freq: Every day | ORAL | Status: AC
  Administered 2018-01-09 – 2018-01-12 (×4): 200 ug
  Filled 2018-01-05 (×4): qty 1
  Filled 2018-01-05 (×3): qty 4
  Filled 2018-01-05: qty 1
  Filled 2018-01-05: qty 4
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
## 2018-01-06 ENCOUNTER — Inpatient Hospital Stay (HOSPITAL_COMMUNITY): Payer: Self-pay

## 2018-01-06 ENCOUNTER — Encounter (HOSPITAL_COMMUNITY): Payer: Self-pay

## 2018-01-06 LAB — CBC
HEMATOCRIT: 28.1 % — AB (ref 39.0–52.0)
Hemoglobin: 8.7 g/dL — ABNORMAL LOW (ref 13.0–17.0)
MCH: 26 pg (ref 26.0–34.0)
MCHC: 31 g/dL (ref 30.0–36.0)
MCV: 84.1 fL (ref 78.0–100.0)
PLATELETS: 484 10*3/uL — AB (ref 150–400)
RBC: 3.34 MIL/uL — AB (ref 4.22–5.81)
RDW: 13.8 % (ref 11.5–15.5)
WBC: 19.6 10*3/uL — AB (ref 4.0–10.5)

## 2018-01-06 LAB — GLUCOSE, CAPILLARY
GLUCOSE-CAPILLARY: 132 mg/dL — AB (ref 65–99)
GLUCOSE-CAPILLARY: 117 mg/dL — AB (ref 65–99)
GLUCOSE-CAPILLARY: 114 mg/dL — AB (ref 65–99)
Glucose-Capillary: 115 mg/dL — ABNORMAL HIGH (ref 65–99)
Glucose-Capillary: 122 mg/dL — ABNORMAL HIGH (ref 65–99)
Glucose-Capillary: 95 mg/dL (ref 65–99)

## 2018-01-06 MED ORDER — IOPAMIDOL (ISOVUE-300) INJECTION 61%
15.0000 mL | INTRAVENOUS | Status: AC
  Administered 2018-01-06 (×2): 15 mL via ORAL

## 2018-01-06 MED ORDER — IOHEXOL 300 MG/ML  SOLN
100.0000 mL | Freq: Once | INTRAMUSCULAR | Status: AC | PRN
  Administered 2018-01-06: 100 mL via INTRAVENOUS
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
## 2018-01-06 NOTE — Progress Notes (Signed)
Pt transported on servo I to CT and back with no issues. VSS NARD will cont to monitor.

## 2018-01-07 LAB — GLUCOSE, CAPILLARY
GLUCOSE-CAPILLARY: 110 mg/dL — AB (ref 65–99)
GLUCOSE-CAPILLARY: 114 mg/dL — AB (ref 65–99)
GLUCOSE-CAPILLARY: 122 mg/dL — AB (ref 65–99)
GLUCOSE-CAPILLARY: 123 mg/dL — AB (ref 65–99)
GLUCOSE-CAPILLARY: 135 mg/dL — AB (ref 65–99)
Glucose-Capillary: 111 mg/dL — ABNORMAL HIGH (ref 65–99)
Glucose-Capillary: 104 mg/dL — ABNORMAL HIGH (ref 65–99)

## 2018-01-07 LAB — CBC WITH DIFFERENTIAL/PLATELET
ABS IMMATURE GRANULOCYTES: 0.2 10*3/uL — AB (ref 0.0–0.1)
BASOS ABS: 0 10*3/uL (ref 0.0–0.1)
BASOS PCT: 0 %
Eosinophils Absolute: 0.4 10*3/uL (ref 0.0–0.7)
Eosinophils Relative: 3 %
HCT: 27 % — ABNORMAL LOW (ref 39.0–52.0)
HEMOGLOBIN: 8.2 g/dL — AB (ref 13.0–17.0)
IMMATURE GRANULOCYTES: 1 %
LYMPHS PCT: 12 %
Lymphs Abs: 2 10*3/uL (ref 0.7–4.0)
MCH: 25.6 pg — ABNORMAL LOW (ref 26.0–34.0)
MCHC: 30.4 g/dL (ref 30.0–36.0)
MCV: 84.4 fL (ref 78.0–100.0)
Monocytes Absolute: 1.9 10*3/uL — ABNORMAL HIGH (ref 0.1–1.0)
Monocytes Relative: 11 %
NEUTROS ABS: 12.6 10*3/uL — AB (ref 1.7–7.7)
NEUTROS PCT: 73 %
Platelets: 571 10*3/uL — ABNORMAL HIGH (ref 150–400)
RBC: 3.2 MIL/uL — AB (ref 4.22–5.81)
RDW: 14.1 % (ref 11.5–15.5)
WBC: 17.1 10*3/uL — ABNORMAL HIGH (ref 4.0–10.5)

## 2018-01-07 LAB — COMPREHENSIVE METABOLIC PANEL
ALBUMIN: 2.3 g/dL — AB (ref 3.5–5.0)
ALK PHOS: 77 U/L (ref 38–126)
ALT: 27 U/L (ref 17–63)
ANION GAP: 11 (ref 5–15)
AST: 34 U/L (ref 15–41)
BUN: 13 mg/dL (ref 6–20)
CALCIUM: 8.6 mg/dL — AB (ref 8.9–10.3)
CO2: 26 mmol/L (ref 22–32)
Chloride: 100 mmol/L — ABNORMAL LOW (ref 101–111)
Creatinine, Ser: 0.83 mg/dL (ref 0.61–1.24)
GFR calc Af Amer: >60 mL/min (ref >60)
GFR calc non Af Amer: >60 mL/min (ref >60)
GLUCOSE: 105 mg/dL — AB (ref 65–99)
Potassium: 3.6 mmol/L (ref 3.5–5.1)
Sodium: 137 mmol/L (ref 135–145)
TOTAL PROTEIN: 6 g/dL — AB (ref 6.5–8.1)
Total Bilirubin: 0.8 mg/dL (ref 0.3–1.2)

## 2018-01-07 LAB — PHOSPHORUS: Phosphorus: 4.1 mg/dL (ref 2.5–4.6)

## 2018-01-07 LAB — MAGNESIUM: Magnesium: 1.9 mg/dL (ref 1.7–2.4)

## 2018-01-07 LAB — TRIGLYCERIDES: TRIGLYCERIDES: 253 mg/dL — AB (ref <150)

## 2018-01-07 MED ORDER — LORAZEPAM 2 MG/ML IJ SOLN
1.0000 mg | Freq: Once | INTRAMUSCULAR | Status: AC
  Administered 2018-01-07: 1 mg via INTRAVENOUS
  Filled 2018-01-07: qty 1

## 2018-01-07 MED ORDER — CLONAZEPAM 0.5 MG PO TBDP
2.0000 mg | ORAL_TABLET | Freq: Two times a day (BID) | ORAL | Status: DC
  Administered 2018-01-07 – 2018-01-09 (×5): 2 mg via ORAL
  Filled 2018-01-07 (×6): qty 4

## 2018-01-07 MED ORDER — FENTANYL CITRATE (PF) 100 MCG/2ML IJ SOLN
50.0000 ug | INTRAMUSCULAR | Status: DC | PRN
Start: 2018-01-07 — End: 2018-01-07
  Administered 2018-01-07 (×2): 100 ug via INTRAVENOUS
  Filled 2018-01-07 (×2): qty 2

## 2018-01-07 MED ORDER — SELENIUM 50 MCG PO TABS: 200.0000 ug | ORAL_TABLET | Freq: Every day | ORAL | Status: DC

## 2018-01-07 MED ORDER — ONDANSETRON HCL 4 MG/2ML IJ SOLN
4.0000 mg | Freq: Four times a day (QID) | INTRAMUSCULAR | Status: DC | PRN
  Administered 2018-01-07 – 2018-01-10 (×3): 4 mg via INTRAVENOUS
  Filled 2018-01-07 (×4): qty 2

## 2018-01-07 MED ORDER — ONDANSETRON HCL 4 MG/2ML IJ SOLN
4.0000 mg | Freq: Once | INTRAMUSCULAR | Status: AC
  Administered 2018-01-07: 4 mg via INTRAVENOUS

## 2018-01-07 MED ORDER — HYDROMORPHONE HCL 1 MG/ML IJ SOLN
0.5000 mg | INTRAMUSCULAR | Status: DC | PRN
  Administered 2018-01-07 – 2018-01-11 (×9): 1 mg via INTRAVENOUS
  Filled 2018-01-07 (×9): qty 1

## 2018-01-07 MED ORDER — BOOST / RESOURCE BREEZE PO LIQD CUSTOM
1.0000 | Freq: Three times a day (TID) | ORAL | Status: DC
  Administered 2018-01-07 – 2018-01-10 (×6): 1 via ORAL
  Administered 2018-01-10: 237 mL via ORAL
  Administered 2018-01-11 (×2): 1 via ORAL

## 2018-01-07 MED ORDER — CLONAZEPAM 1 MG PO TABS
2.0000 mg | ORAL_TABLET | Freq: Once | ORAL | Status: AC
  Administered 2018-01-07: 2 mg
  Filled 2018-01-07: qty 2
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
## 2018-01-07 NOTE — Procedures (Signed)
Extubation Procedure Note  Patient Details:   Name: [name] DOB: 11/16/1970 MRN: [id number]   Airway Documentation:    Vent end date: 01/07/18 Vent end time: 0957   Evaluation  O2 sats: stable throughout Complications: No apparent complications Patient did tolerate procedure well. Bilateral Breath Sounds: Rhonchi   Yes  Pt extubated to 3L [place] per MD order. Positive cuff leak noted prior to extubation. VS within normal limits. Pt with strong productive cough post extubation and was encouraged to use [place] to clear secretions. No stridor noted at this time. MD at bedside. RT will continue to monitor.    Jesse Sans 01/07/2018, 9:58 AM
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
## 2018-01-07 NOTE — Consult Note (Addendum)
[place] Nurse ostomy follow up Teaching and pouch change performed in [facility] 4N15.  Patient's spouse, Otila Kluver, present and participating throughout. Otila Kluver performed all aspects of the pouch change process with standby assistance.  She is proficient in knowing and performing all steps.  Additional supplies are at the bedside. Stoma type/location: LUQ colostomy Stomal assessment/size: 38 mm top to bottom; 44 mm side to side Peristomal assessment: Intact, normal color and texture.  Sutures intact. Treatment options for stomal/peristomal skin: Barrier ring Output; drops only of serous fluid in existing pouch Ostomy pouching: 2pc.  Education provided: Reinforcement of pouch change process. Enrolled patient in [place] Start Discharge program: Yes  Val Riles, RN, MSN, [facility], CNS-BC, pager [phone]

## 2018-01-08 ENCOUNTER — Inpatient Hospital Stay (HOSPITAL_COMMUNITY): Payer: Self-pay

## 2018-01-08 LAB — URINALYSIS, ROUTINE W REFLEX MICROSCOPIC
Bilirubin Urine: NEGATIVE
GLUCOSE, UA: NEGATIVE mg/dL
Ketones, ur: 5 mg/dL — AB
LEUKOCYTES UA: NEGATIVE
Nitrite: NEGATIVE
PH: 5 (ref 5.0–8.0)
Protein, ur: 30 mg/dL — AB
SPECIFIC GRAVITY, URINE: 1.029 (ref 1.005–1.030)

## 2018-01-08 LAB — POCT I-STAT 3, ART BLOOD GAS (G3+)
Acid-Base Excess: 2 mmol/L (ref 0.0–2.0)
BICARBONATE: 25.5 mmol/L (ref 20.0–28.0)
O2 Saturation: 96 %
PCO2 ART: 34.7 mmHg (ref 32.0–48.0)
PO2 ART: 77 mmHg — AB (ref 83.0–108.0)
TCO2: 27 mmol/L (ref 22–32)
pH, Arterial: 7.474 — ABNORMAL HIGH (ref 7.350–7.450)

## 2018-01-08 LAB — BASIC METABOLIC PANEL
ANION GAP: 10 (ref 5–15)
BUN: 13 mg/dL (ref 6–20)
CHLORIDE: 103 mmol/L (ref 98–111)
CO2: 26 mmol/L (ref 22–32)
Calcium: 8.7 mg/dL — ABNORMAL LOW (ref 8.9–10.3)
Creatinine, Ser: 0.74 mg/dL (ref 0.61–1.24)
GFR calc Af Amer: >60 mL/min (ref >60)
Glucose, Bld: 122 mg/dL — ABNORMAL HIGH (ref 70–99)
POTASSIUM: 3.7 mmol/L (ref 3.5–5.1)
SODIUM: 139 mmol/L (ref 135–145)

## 2018-01-08 LAB — GLUCOSE, CAPILLARY
GLUCOSE-CAPILLARY: 107 mg/dL — AB (ref 70–99)
GLUCOSE-CAPILLARY: 107 mg/dL — AB (ref 70–99)
Glucose-Capillary: 128 mg/dL — ABNORMAL HIGH (ref 70–99)
Glucose-Capillary: 152 mg/dL — ABNORMAL HIGH (ref 70–99)
Glucose-Capillary: 112 mg/dL — ABNORMAL HIGH (ref 70–99)

## 2018-01-08 LAB — CBC
HCT: 28 % — ABNORMAL LOW (ref 39.0–52.0)
HEMOGLOBIN: 8.7 g/dL — AB (ref 13.0–17.0)
MCH: 26 pg (ref 26.0–34.0)
MCHC: 31.1 g/dL (ref 30.0–36.0)
MCV: 83.6 fL (ref 78.0–100.0)
Platelets: 694 10*3/uL — ABNORMAL HIGH (ref 150–400)
RBC: 3.35 MIL/uL — ABNORMAL LOW (ref 4.22–5.81)
RDW: 14 % (ref 11.5–15.5)
WBC: 17.7 10*3/uL — AB (ref 4.0–10.5)

## 2018-01-08 MED ORDER — LORAZEPAM 2 MG/ML IJ SOLN
1.0000 mg | Freq: Once | INTRAMUSCULAR | Status: DC
  Filled 2018-01-08: qty 1

## 2018-01-08 MED ORDER — DEXMEDETOMIDINE HCL IN NACL 400 MCG/100ML IV SOLN
0.2000 ug/kg/h | INTRAVENOUS | Status: DC
  Administered 2018-01-08: 0.7 ug/kg/h via INTRAVENOUS
  Administered 2018-01-08: 0.6 ug/kg/h via INTRAVENOUS
  Administered 2018-01-08: 0.4 ug/kg/h via INTRAVENOUS
  Administered 2018-01-09: 0.6 ug/kg/h via INTRAVENOUS
  Filled 2018-01-08 (×4): qty 100

## 2018-01-08 NOTE — Progress Notes (Signed)
MD called back, wanted BMET and CBC to be ordered if they were not already ordered. Labs already drawn. Will continue to monitor patient.

## 2018-01-08 NOTE — Progress Notes (Signed)
Patient hallucinating(visual), confused and heart rate sustaining in the 120's. MD paged to notify.

## 2018-01-08 NOTE — Progress Notes (Signed)
Patient continued to vomit after administering Zofran. MD paged and new order to place NG tube to low intermittent suction received.
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
## 2018-01-08 NOTE — Progress Notes (Signed)
Patient vomited, Zofran not due at this time. MD paged. MD called back with new orders.

## 2018-01-08 NOTE — Progress Notes (Signed)
Patient refused blood sugar check. Patient educated appropriately

## 2018-01-09 ENCOUNTER — Other Ambulatory Visit: Payer: Self-pay

## 2018-01-09 ENCOUNTER — Inpatient Hospital Stay (HOSPITAL_COMMUNITY): Payer: Self-pay

## 2018-01-09 DIAGNOSIS — Z87898 Personal history of other specified conditions: Secondary | ICD-10-CM

## 2018-01-09 DIAGNOSIS — F1010 Alcohol abuse, uncomplicated: Secondary | ICD-10-CM

## 2018-01-09 DIAGNOSIS — R6510 Systemic inflammatory response syndrome (SIRS) of non-infectious origin without acute organ dysfunction: Secondary | ICD-10-CM

## 2018-01-09 DIAGNOSIS — W3400XA Accidental discharge from unspecified firearms or gun, initial encounter: Secondary | ICD-10-CM

## 2018-01-09 DIAGNOSIS — G8918 Other acute postprocedural pain: Secondary | ICD-10-CM

## 2018-01-09 DIAGNOSIS — Z720 Tobacco use: Secondary | ICD-10-CM

## 2018-01-09 DIAGNOSIS — S31139A Puncture wound of abdominal wall without foreign body, unspecified quadrant without penetration into peritoneal cavity, initial encounter: Secondary | ICD-10-CM

## 2018-01-09 DIAGNOSIS — D72829 Elevated white blood cell count, unspecified: Secondary | ICD-10-CM

## 2018-01-09 DIAGNOSIS — D62 Acute posthemorrhagic anemia: Secondary | ICD-10-CM

## 2018-01-09 LAB — COMPREHENSIVE METABOLIC PANEL
ALT: 20 U/L (ref 0–44)
AST: 22 U/L (ref 15–41)
Albumin: 2.2 g/dL — ABNORMAL LOW (ref 3.5–5.0)
Alkaline Phosphatase: 67 U/L (ref 38–126)
Anion gap: 10 (ref 5–15)
BILIRUBIN TOTAL: 0.7 mg/dL (ref 0.3–1.2)
BUN: 18 mg/dL (ref 6–20)
CO2: 28 mmol/L (ref 22–32)
Calcium: 8.7 mg/dL — ABNORMAL LOW (ref 8.9–10.3)
Chloride: 104 mmol/L (ref 98–111)
Creatinine, Ser: 0.8 mg/dL (ref 0.61–1.24)
GFR calc Af Amer: >60 mL/min (ref >60)
Glucose, Bld: 97 mg/dL (ref 70–99)
Potassium: 3.5 mmol/L (ref 3.5–5.1)
Sodium: 142 mmol/L (ref 135–145)
TOTAL PROTEIN: 6.1 g/dL — AB (ref 6.5–8.1)

## 2018-01-09 LAB — GLUCOSE, CAPILLARY
GLUCOSE-CAPILLARY: 102 mg/dL — AB (ref 70–99)
Glucose-Capillary: 93 mg/dL (ref 70–99)
Glucose-Capillary: 96 mg/dL (ref 70–99)
Glucose-Capillary: 82 mg/dL (ref 70–99)
Glucose-Capillary: 103 mg/dL — ABNORMAL HIGH (ref 70–99)

## 2018-01-09 LAB — CBC
HEMATOCRIT: 25.5 % — AB (ref 39.0–52.0)
Hemoglobin: 7.7 g/dL — ABNORMAL LOW (ref 13.0–17.0)
MCH: 25.7 pg — ABNORMAL LOW (ref 26.0–34.0)
MCHC: 30.2 g/dL (ref 30.0–36.0)
MCV: 85 fL (ref 78.0–100.0)
Platelets: 722 10*3/uL — ABNORMAL HIGH (ref 150–400)
RBC: 3 MIL/uL — ABNORMAL LOW (ref 4.22–5.81)
RDW: 14.2 % (ref 11.5–15.5)
WBC: 18 10*3/uL — AB (ref 4.0–10.5)

## 2018-01-09 MED ORDER — BETHANECHOL CHLORIDE 10 MG PO TABS
10.0000 mg | ORAL_TABLET | Freq: Three times a day (TID) | ORAL | Status: DC
  Administered 2018-01-09 – 2018-01-14 (×17): 10 mg via ORAL
  Filled 2018-01-09 (×17): qty 1

## 2018-01-09 MED ORDER — VITAMIN E 180 MG (400 UNIT) PO CAPS
400.0000 [IU] | ORAL_CAPSULE | Freq: Every day | ORAL | Status: DC
  Administered 2018-01-09 – 2018-01-14 (×6): 400 [IU] via ORAL
  Filled 2018-01-09 (×6): qty 1

## 2018-01-09 MED ORDER — TAMSULOSIN HCL 0.4 MG PO CAPS
0.4000 mg | ORAL_CAPSULE | Freq: Every day | ORAL | Status: DC
  Administered 2018-01-09 – 2018-01-14 (×6): 0.4 mg via ORAL
  Filled 2018-01-09 (×6): qty 1

## 2018-01-09 MED ORDER — QUETIAPINE FUMARATE 100 MG PO TABS
100.0000 mg | ORAL_TABLET | Freq: Three times a day (TID) | ORAL | Status: DC
  Administered 2018-01-09 – 2018-01-10 (×6): 100 mg
  Filled 2018-01-09 (×6): qty 1
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
## 2018-01-09 NOTE — Progress Notes (Signed)
Rehab Admissions Coordinator Note:  Patient was screened by Cleatrice Burke for appropriateness for an Inpatient Acute Rehab Consult per OT eval ( in incomplete section).  At this time, we are recommending await further progress before requesting rehab consult.. I will follow.  Danne Baxter, RN, MSN Rehab Admissions Coordinator [phone] 01/09/2018 2:17 PM

## 2018-01-09 NOTE — Progress Notes (Signed)
NGT is hooked up to [facility] for 1 hour per order. Will continue to monitor.
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
## 2018-01-10 LAB — CBC WITH DIFFERENTIAL/PLATELET
Abs Immature Granulocytes: 0.5 10*3/uL — ABNORMAL HIGH (ref 0.0–0.1)
Basophils Absolute: 0.1 10*3/uL (ref 0.0–0.1)
Basophils Relative: 0 %
EOS PCT: 4 %
Eosinophils Absolute: 0.6 10*3/uL (ref 0.0–0.7)
HEMATOCRIT: 24.4 % — AB (ref 39.0–52.0)
HEMOGLOBIN: 7.4 g/dL — AB (ref 13.0–17.0)
Immature Granulocytes: 3 %
LYMPHS ABS: 1.5 10*3/uL (ref 0.7–4.0)
LYMPHS PCT: 9 %
MCH: 25.9 pg — AB (ref 26.0–34.0)
MCHC: 30.3 g/dL (ref 30.0–36.0)
MCV: 85.3 fL (ref 78.0–100.0)
MONO ABS: 1.2 10*3/uL — AB (ref 0.1–1.0)
Monocytes Relative: 7 %
Neutro Abs: 13.4 10*3/uL — ABNORMAL HIGH (ref 1.7–7.7)
Neutrophils Relative %: 77 %
Platelets: 694 10*3/uL — ABNORMAL HIGH (ref 150–400)
RBC: 2.86 MIL/uL — AB (ref 4.22–5.81)
RDW: 14.5 % (ref 11.5–15.5)
WBC: 17.2 10*3/uL — ABNORMAL HIGH (ref 4.0–10.5)

## 2018-01-10 LAB — BASIC METABOLIC PANEL
Anion gap: 10 (ref 5–15)
BUN: 13 mg/dL (ref 6–20)
CO2: 26 mmol/L (ref 22–32)
CREATININE: 0.8 mg/dL (ref 0.61–1.24)
Calcium: 8.1 mg/dL — ABNORMAL LOW (ref 8.9–10.3)
Chloride: 106 mmol/L (ref 98–111)
Glucose, Bld: 105 mg/dL — ABNORMAL HIGH (ref 70–99)
Potassium: 2.9 mmol/L — ABNORMAL LOW (ref 3.5–5.1)
SODIUM: 142 mmol/L (ref 135–145)

## 2018-01-10 LAB — GLUCOSE, CAPILLARY
GLUCOSE-CAPILLARY: 104 mg/dL — AB (ref 70–99)
GLUCOSE-CAPILLARY: 111 mg/dL — AB (ref 70–99)
GLUCOSE-CAPILLARY: 149 mg/dL — AB (ref 70–99)

## 2018-01-10 MED ORDER — CLONAZEPAM 0.5 MG PO TBDP
1.0000 mg | ORAL_TABLET | Freq: Two times a day (BID) | ORAL | Status: DC
  Administered 2018-01-10 (×2): 1 mg via ORAL
  Filled 2018-01-10 (×2): qty 2

## 2018-01-10 MED ORDER — CHLORHEXIDINE GLUCONATE 0.12 % MT SOLN
OROMUCOSAL | Status: AC
  Filled 2018-01-10: qty 15

## 2018-01-10 MED ORDER — DOCUSATE SODIUM 100 MG PO CAPS
100.0000 mg | ORAL_CAPSULE | Freq: Two times a day (BID) | ORAL | Status: DC
Start: 2018-01-10 — End: 2018-01-14
  Administered 2018-01-10 – 2018-01-14 (×9): 100 mg via ORAL
  Filled 2018-01-10 (×9): qty 1

## 2018-01-10 MED ORDER — POTASSIUM CHLORIDE 10 MEQ/100ML IV SOLN
10.0000 meq | INTRAVENOUS | Status: AC
  Administered 2018-01-10 (×4): 10 meq via INTRAVENOUS
  Filled 2018-01-10 (×4): qty 100

## 2018-01-10 NOTE — Progress Notes (Signed)
Inpatient Rehabilitation-Admissions Coordinator    Met with patient and wife at the bedside to discuss team's recommendation for inpatient rehabilitation. Shared booklets, expectations while in CIR, expected length of stay, and anticipated functional level at DC. Wife feels confident she can provide the 24/7 assist needed at DC from CIR. However, when [facility] discussed payment for CIR, family unsure of how to cover estimated cost as pt currently uninsured and awaiting potential disability. AC explained she cannot guarantee he would get approved for Medicaid and family must understand the possible estimated cost. Family wants time to discuss options before making a decision due to financial burden that they may acquire. AC to follow up tomorrow for family decision. Please call if questions.   Jhonnie Garner, OTR/L  Rehab Admissions Coordinator  [phone] 01/10/2018 12:36 PM

## 2018-01-10 NOTE — Consult Note (Signed)
[place] Nurse wound follow up Wound type:surgical full thickness Measurement:22cm x 4cm x 3cm  Wound bed: 95% beefy red wound bed, 5% tan slough Drainage (amount, consistency, odor) moderate brown exudate Periwound:intact Dressing procedure/placement/frequency: The WOC team saw patient today for ostomy teaching and realized the wound vac had not been changed. Will update orders for bedside RN to begin changing Tue-Thur-Sat schedule. My partner, Val Riles, spoke with Dr. Georganna Skeans about someone from surgery looking at wound Saturday to see if wound vac still needed. Vac was changed, one black sponge applied, drape placed, suction at 125 mmHg continuously, seal achieved immediately.    [place] Nurse ostomy follow up Stoma type/location: LUQ colostomy Stomal assessment/size: 1 1/2"  Peristomal assessment: intact Treatment options for stomal/peristomal skin: skin barrier  Output 50cc brown effluent Ostomy pouching: 2pc.  Education provided: Wife was educated on stoma changing size and has to be measured with each change now. Educated her on the fact that it will eventually stop changing size and the correct size skin barrier can be ordered. Wife very involved and asking appropriate questions.  Enrolled patient in [facility] Discharge program: Yes, previously.
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
## 2018-01-10 NOTE — Progress Notes (Signed)
Notified on-call PA of patient's Potassium 2.9. Was told an order would be put in to address her potassium level.

## 2018-01-10 NOTE — Progress Notes (Signed)
Discounted patient from [facility] and clamped the NGT
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
## 2018-01-10 NOTE — Plan of Care (Signed)
  Problem: Pain Managment: Goal: General experience of comfort will improve Outcome: Progressing   

## 2018-01-10 NOTE — Progress Notes (Signed)
Patient wife is concerned about change in patient behavior after receiving Klonopin. She believes it cause patient to become restless and more aggressive.

## 2018-01-11 ENCOUNTER — Inpatient Hospital Stay (HOSPITAL_COMMUNITY): Payer: Self-pay

## 2018-01-11 LAB — BASIC METABOLIC PANEL
Anion gap: 9 (ref 5–15)
BUN: 6 mg/dL (ref 6–20)
CO2: 26 mmol/L (ref 22–32)
Calcium: 8.2 mg/dL — ABNORMAL LOW (ref 8.9–10.3)
Chloride: 104 mmol/L (ref 98–111)
Creatinine, Ser: 0.72 mg/dL (ref 0.61–1.24)
GFR calc Af Amer: >60 mL/min (ref >60)
GFR calc non Af Amer: >60 mL/min (ref >60)
Glucose, Bld: 106 mg/dL — ABNORMAL HIGH (ref 70–99)
Potassium: 2.9 mmol/L — ABNORMAL LOW (ref 3.5–5.1)
Sodium: 139 mmol/L (ref 135–145)

## 2018-01-11 LAB — CBC
HCT: 24.2 % — ABNORMAL LOW (ref 39.0–52.0)
Hemoglobin: 7.3 g/dL — ABNORMAL LOW (ref 13.0–17.0)
MCH: 25.7 pg — ABNORMAL LOW (ref 26.0–34.0)
MCHC: 30.2 g/dL (ref 30.0–36.0)
MCV: 85.2 fL (ref 78.0–100.0)
Platelets: 712 10*3/uL — ABNORMAL HIGH (ref 150–400)
RBC: 2.84 MIL/uL — ABNORMAL LOW (ref 4.22–5.81)
RDW: 14.6 % (ref 11.5–15.5)
WBC: 17.9 10*3/uL — ABNORMAL HIGH (ref 4.0–10.5)

## 2018-01-11 MED ORDER — ENSURE ENLIVE PO LIQD
237.0000 mL | Freq: Two times a day (BID) | ORAL | Status: DC
  Administered 2018-01-11 – 2018-01-14 (×6): 237 mL via ORAL

## 2018-01-11 MED ORDER — ACETAMINOPHEN 325 MG PO TABS
650.0000 mg | ORAL_TABLET | Freq: Four times a day (QID) | ORAL | Status: DC
  Administered 2018-01-11 – 2018-01-14 (×13): 650 mg via ORAL
  Filled 2018-01-11 (×13): qty 2

## 2018-01-11 MED ORDER — HYDROMORPHONE HCL 1 MG/ML IJ SOLN
0.5000 mg | INTRAMUSCULAR | Status: DC | PRN
  Administered 2018-01-11 – 2018-01-14 (×4): 1 mg via INTRAVENOUS
  Filled 2018-01-11 (×4): qty 1

## 2018-01-11 MED ORDER — CLONAZEPAM 0.5 MG PO TBDP
0.5000 mg | ORAL_TABLET | Freq: Two times a day (BID) | ORAL | Status: DC
  Administered 2018-01-11 – 2018-01-14 (×7): 0.5 mg via ORAL
  Filled 2018-01-11 (×7): qty 1

## 2018-01-11 MED ORDER — POTASSIUM CHLORIDE 10 MEQ/100ML IV SOLN
10.0000 meq | INTRAVENOUS | Status: AC
  Administered 2018-01-11 (×5): 10 meq via INTRAVENOUS
  Filled 2018-01-11 (×5): qty 100

## 2018-01-11 MED ORDER — OXYCODONE HCL 5 MG PO TABS
5.0000 mg | ORAL_TABLET | ORAL | Status: DC | PRN
  Administered 2018-01-11 – 2018-01-14 (×11): 10 mg via ORAL
  Administered 2018-01-14: 5 mg via ORAL
  Filled 2018-01-11 (×7): qty 2
  Filled 2018-01-11: qty 1
  Filled 2018-01-11 (×4): qty 2

## 2018-01-11 MED ORDER — QUETIAPINE FUMARATE 50 MG PO TABS
50.0000 mg | ORAL_TABLET | Freq: Three times a day (TID) | ORAL | Status: DC
  Administered 2018-01-11 – 2018-01-13 (×7): 50 mg
  Filled 2018-01-11 (×7): qty 1

## 2018-01-11 MED ORDER — CHLORPROMAZINE HCL 25 MG/ML IJ SOLN
25.0000 mg | Freq: Four times a day (QID) | INTRAMUSCULAR | Status: DC | PRN
  Filled 2018-01-11: qty 1

## 2018-01-11 MED ORDER — BOOST / RESOURCE BREEZE PO LIQD CUSTOM
1.0000 | Freq: Every day | ORAL | Status: DC
  Administered 2018-01-12 – 2018-01-14 (×3): 1 via ORAL

## 2018-01-11 MED ORDER — TAB-A-VITE/IRON PO TABS
1.0000 | ORAL_TABLET | Freq: Every day | ORAL | Status: DC
  Administered 2018-01-11 – 2018-01-14 (×4): 1 via ORAL
  Filled 2018-01-11 (×4): qty 1
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
## 2018-01-11 NOTE — Progress Notes (Addendum)
Inpatient Rehabilitation-Admissions Coordinator   Family requesting more time to discuss options before making a decision regarding CIR due to financial burden they may acquire if Medicaid not obtained. AC to follow up Monday for family decision. Please call if questions.  Jhonnie Garner, OTR/L  Rehab Admissions Coordinator  [phone] 01/11/2018 2:22 PM
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
## 2018-01-12 LAB — CBC
HEMATOCRIT: 25.9 % — AB (ref 39.0–52.0)
HEMOGLOBIN: 8 g/dL — AB (ref 13.0–17.0)
MCH: 25.8 pg — ABNORMAL LOW (ref 26.0–34.0)
MCHC: 30.9 g/dL (ref 30.0–36.0)
MCV: 83.5 fL (ref 78.0–100.0)
Platelets: 763 10*3/uL — ABNORMAL HIGH (ref 150–400)
RBC: 3.1 MIL/uL — ABNORMAL LOW (ref 4.22–5.81)
RDW: 14.3 % (ref 11.5–15.5)
WBC: 15.1 10*3/uL — AB (ref 4.0–10.5)

## 2018-01-12 LAB — URINE CULTURE

## 2018-01-12 LAB — BASIC METABOLIC PANEL
ANION GAP: 9 (ref 5–15)
BUN: <5 mg/dL — ABNORMAL LOW (ref 6–20)
CHLORIDE: 101 mmol/L (ref 98–111)
CO2: 26 mmol/L (ref 22–32)
Calcium: 8.5 mg/dL — ABNORMAL LOW (ref 8.9–10.3)
Creatinine, Ser: 0.71 mg/dL (ref 0.61–1.24)
GFR calc non Af Amer: >60 mL/min (ref >60)
Glucose, Bld: 101 mg/dL — ABNORMAL HIGH (ref 70–99)
Potassium: 3.1 mmol/L — ABNORMAL LOW (ref 3.5–5.1)
Sodium: 136 mmol/L (ref 135–145)
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
## 2018-01-13 MED ORDER — QUETIAPINE FUMARATE 50 MG PO TABS
50.0000 mg | ORAL_TABLET | Freq: Three times a day (TID) | ORAL | Status: DC
  Administered 2018-01-13 – 2018-01-14 (×3): 50 mg via ORAL
  Filled 2018-01-13 (×3): qty 1

## 2018-01-13 NOTE — Progress Notes (Signed)
Trauma Service Note  Subjective: Patient doing okay.  Concerned about disposition.  Cannot afford Rehab at $3000 per day.  Objective: Vital signs in last 24 hours: Temp:  [98.5 F (36.9 C)-99.3 F (37.4 C)] 98.7 F (37.1 C) (06/30 0821) Pulse Rate:  [80-95] 80 (06/30 0821) Resp:  [16-23] 18 (06/30 0821) BP: (123-134)/(77-88) 123/82 (06/30 0821) SpO2:  [93 %-100 %] 100 % (06/30 0821) Last BM Date: 01/12/18  Intake/Output from previous day: 06/29 0701 - 06/30 0700 In: 2710.2 [P.O.:960; I.V.:1600.2; IV Piggyback:150] Out: 2655 [Urine:2625; Drains:25; Stool:5] Intake/Output this shift: Total I/O In: -  Out: 350 [Urine:350]  General: No acute distress  Lungs: Clear  Abd: Benign.  Ostomy doing well.  Will advance diet.  Extremities: No changes  Neuro: Intact  Lab Results: CBC  Recent Labs    01/11/18 0307 01/12/18 0341  WBC 17.9* 15.1*  HGB 7.3* 8.0*  HCT 24.2* 25.9*  PLT 712* 763*   BMET Recent Labs    01/11/18 0307 01/12/18 0341  NA 139 136  K 2.9* 3.1*  CL 104 101  CO2 26 26  GLUCOSE 106* 101*  BUN 6 <5*  CREATININE 0.72 0.71  CALCIUM 8.2* 8.5*   PT/INR No results for input(s): LABPROT, INR in the last 72 hours. ABG No results for input(s): PHART, HCO3 in the last 72 hours.  Invalid input(s): PCO2, PO2  Studies/Results: No results found.  Anti-infectives: Anti-infectives (From admission, onward)   Start     Dose/Rate Route Frequency Ordered Stop   12/29/17 1430  piperacillin-tazobactam (ZOSYN) IVPB 3.375 g  Status:  Discontinued     3.375 g 12.5 mL/hr over 240 Minutes Intravenous Every 8 hours 12/29/17 1342 12/31/17 1038   12/29/17 0345  piperacillin-tazobactam (ZOSYN) IVPB 3.375 g     3.375 g 100 mL/hr over 30 Minutes Intravenous  Once 12/29/17 0336 12/29/17 0325      Assessment/Plan: s/p Procedure(s): EXPLORATORY LAPAROTOMY COLOSTOMY Advance diet Discuss more about rehab.  LOS: 15 days   Kathryne Eriksson. Dahlia Bailiff, MD,  FACS [phone] Trauma Surgeon 01/13/2018

## 2018-01-14 ENCOUNTER — Encounter (HOSPITAL_COMMUNITY): Payer: Self-pay | Admitting: *Deleted

## 2018-01-14 ENCOUNTER — Inpatient Hospital Stay (HOSPITAL_COMMUNITY)
Admission: RE | Admit: 2018-01-14 | Discharge: 2018-01-23 | DRG: 949 | Disposition: A | Discharge status: Home / Self Care | Payer: Self-pay | Source: Intra-hospital | Attending: Physical Medicine & Rehabilitation | Admitting: Physical Medicine & Rehabilitation

## 2018-01-14 ENCOUNTER — Encounter (HOSPITAL_BASED_OUTPATIENT_CLINIC_OR_DEPARTMENT_OTHER): Payer: Self-pay | Admitting: Emergency Medicine

## 2018-01-14 DIAGNOSIS — I82491 Acute embolism and thrombosis of other specified deep vein of right lower extremity: Secondary | ICD-10-CM | POA: Diagnosis present

## 2018-01-14 DIAGNOSIS — S31139D Puncture wound of abdominal wall without foreign body, unspecified quadrant without penetration into peritoneal cavity, subsequent encounter: Secondary | ICD-10-CM

## 2018-01-14 DIAGNOSIS — R110 Nausea: Secondary | ICD-10-CM | POA: Diagnosis not present

## 2018-01-14 DIAGNOSIS — Z79899 Other long term (current) drug therapy: Secondary | ICD-10-CM

## 2018-01-14 DIAGNOSIS — D72829 Elevated white blood cell count, unspecified: Secondary | ICD-10-CM | POA: Diagnosis present

## 2018-01-14 DIAGNOSIS — R109 Unspecified abdominal pain: Secondary | ICD-10-CM

## 2018-01-14 DIAGNOSIS — E871 Hypo-osmolality and hyponatremia: Secondary | ICD-10-CM | POA: Diagnosis not present

## 2018-01-14 DIAGNOSIS — D62 Acute posthemorrhagic anemia: Secondary | ICD-10-CM | POA: Diagnosis present

## 2018-01-14 DIAGNOSIS — K5900 Constipation, unspecified: Secondary | ICD-10-CM | POA: Diagnosis not present

## 2018-01-14 DIAGNOSIS — G479 Sleep disorder, unspecified: Secondary | ICD-10-CM | POA: Diagnosis present

## 2018-01-14 DIAGNOSIS — F411 Generalized anxiety disorder: Secondary | ICD-10-CM | POA: Diagnosis present

## 2018-01-14 DIAGNOSIS — F4311 Post-traumatic stress disorder, acute: Secondary | ICD-10-CM | POA: Diagnosis present

## 2018-01-14 DIAGNOSIS — S31139S Puncture wound of abdominal wall without foreign body, unspecified quadrant without penetration into peritoneal cavity, sequela: Secondary | ICD-10-CM

## 2018-01-14 DIAGNOSIS — F4001 Agoraphobia with panic disorder: Secondary | ICD-10-CM | POA: Diagnosis present

## 2018-01-14 DIAGNOSIS — Z4889 Encounter for other specified surgical aftercare: Principal | ICD-10-CM

## 2018-01-14 DIAGNOSIS — F329 Major depressive disorder, single episode, unspecified: Secondary | ICD-10-CM | POA: Diagnosis present

## 2018-01-14 DIAGNOSIS — M75102 Unspecified rotator cuff tear or rupture of left shoulder, not specified as traumatic: Secondary | ICD-10-CM | POA: Diagnosis present

## 2018-01-14 DIAGNOSIS — I824Z1 Acute embolism and thrombosis of unspecified deep veins of right distal lower extremity: Secondary | ICD-10-CM

## 2018-01-14 DIAGNOSIS — R339 Retention of urine, unspecified: Secondary | ICD-10-CM

## 2018-01-14 DIAGNOSIS — R5381 Other malaise: Secondary | ICD-10-CM | POA: Diagnosis present

## 2018-01-14 DIAGNOSIS — W3400XS Accidental discharge from unspecified firearms or gun, sequela: Secondary | ICD-10-CM

## 2018-01-14 DIAGNOSIS — N390 Urinary tract infection, site not specified: Secondary | ICD-10-CM

## 2018-01-14 DIAGNOSIS — Z933 Colostomy status: Secondary | ICD-10-CM

## 2018-01-14 DIAGNOSIS — F4310 Post-traumatic stress disorder, unspecified: Secondary | ICD-10-CM

## 2018-01-14 DIAGNOSIS — Z791 Long term (current) use of non-steroidal anti-inflammatories (NSAID): Secondary | ICD-10-CM

## 2018-01-14 DIAGNOSIS — F4312 Post-traumatic stress disorder, chronic: Secondary | ICD-10-CM | POA: Diagnosis present

## 2018-01-14 DIAGNOSIS — K5901 Slow transit constipation: Secondary | ICD-10-CM

## 2018-01-14 DIAGNOSIS — F17210 Nicotine dependence, cigarettes, uncomplicated: Secondary | ICD-10-CM | POA: Diagnosis present

## 2018-01-14 DIAGNOSIS — S32392D Other fracture of left ilium, subsequent encounter for fracture with routine healing: Secondary | ICD-10-CM

## 2018-01-14 LAB — CBC
HCT: 24.8 % — ABNORMAL LOW (ref 39.0–52.0)
HEMOGLOBIN: 7.7 g/dL — AB (ref 13.0–17.0)
MCH: 26 pg (ref 26.0–34.0)
MCHC: 31 g/dL (ref 30.0–36.0)
MCV: 83.8 fL (ref 78.0–100.0)
Platelets: 712 10*3/uL — ABNORMAL HIGH (ref 150–400)
RBC: 2.96 MIL/uL — AB (ref 4.22–5.81)
RDW: 14.9 % (ref 11.5–15.5)
WBC: 22.5 10*3/uL — ABNORMAL HIGH (ref 4.0–10.5)

## 2018-01-14 LAB — CREATININE, SERUM
Creatinine, Ser: 0.72 mg/dL (ref 0.61–1.24)
GFR calc Af Amer: >60 mL/min (ref >60)
GFR calc non Af Amer: >60 mL/min (ref >60)

## 2018-01-14 MED ORDER — TAB-A-VITE/IRON PO TABS
1.0000 | ORAL_TABLET | Freq: Every day | ORAL | Status: DC
  Administered 2018-01-15 – 2018-01-23 (×9): 1 via ORAL
  Filled 2018-01-14 (×9): qty 1

## 2018-01-14 MED ORDER — ONDANSETRON HCL 4 MG/2ML IJ SOLN: 4.0000 mg | Freq: Four times a day (QID) | INTRAMUSCULAR | Status: DC | PRN

## 2018-01-14 MED ORDER — ALBUTEROL SULFATE (2.5 MG/3ML) 0.083% IN NEBU: 2.5000 mg | INHALATION_SOLUTION | RESPIRATORY_TRACT | Status: DC | PRN

## 2018-01-14 MED ORDER — VITAMIN E 180 MG (400 UNIT) PO CAPS
400.0000 [IU] | ORAL_CAPSULE | Freq: Every day | ORAL | Status: DC
  Administered 2018-01-15 – 2018-01-23 (×9): 400 [IU] via ORAL
  Filled 2018-01-14 (×9): qty 1

## 2018-01-14 MED ORDER — ACETAMINOPHEN 325 MG PO TABS
650.0000 mg | ORAL_TABLET | Freq: Four times a day (QID) | ORAL | Status: DC
  Administered 2018-01-14 – 2018-01-23 (×34): 650 mg via ORAL
  Filled 2018-01-14 (×34): qty 2

## 2018-01-14 MED ORDER — SORBITOL 70 % SOLN
30.0000 mL | Freq: Every day | Status: DC | PRN
  Administered 2018-01-15 – 2018-01-21 (×5): 30 mL via ORAL
  Filled 2018-01-14 (×5): qty 30

## 2018-01-14 MED ORDER — CLONAZEPAM 0.5 MG PO TBDP
0.5000 mg | ORAL_TABLET | Freq: Two times a day (BID) | ORAL | Status: DC | PRN
Start: 2018-01-14 — End: 2018-01-14

## 2018-01-14 MED ORDER — ONDANSETRON HCL 4 MG PO TABS
4.0000 mg | ORAL_TABLET | Freq: Four times a day (QID) | ORAL | Status: DC | PRN
  Administered 2018-01-19: 4 mg via ORAL
  Filled 2018-01-14: qty 1

## 2018-01-14 MED ORDER — BETHANECHOL CHLORIDE 10 MG PO TABS
10.0000 mg | ORAL_TABLET | Freq: Three times a day (TID) | ORAL | Status: DC
  Administered 2018-01-14 – 2018-01-23 (×26): 10 mg via ORAL
  Filled 2018-01-14 (×26): qty 1

## 2018-01-14 MED ORDER — FAMOTIDINE 20 MG PO TABS
20.0000 mg | ORAL_TABLET | Freq: Every day | ORAL | Status: DC
  Administered 2018-01-15 – 2018-01-23 (×9): 20 mg via ORAL
  Filled 2018-01-14 (×9): qty 1

## 2018-01-14 MED ORDER — BOOST / RESOURCE BREEZE PO LIQD CUSTOM
1.0000 | Freq: Every day | ORAL | Status: DC
  Administered 2018-01-18 – 2018-01-22 (×3): 1 via ORAL

## 2018-01-14 MED ORDER — OXYCODONE HCL 5 MG PO TABS
5.0000 mg | ORAL_TABLET | ORAL | Status: DC | PRN
  Administered 2018-01-14 – 2018-01-15 (×5): 10 mg via ORAL
  Administered 2018-01-15: 5 mg via ORAL
  Administered 2018-01-16: 10 mg via ORAL
  Administered 2018-01-16: 5 mg via ORAL
  Administered 2018-01-16 – 2018-01-18 (×9): 10 mg via ORAL
  Administered 2018-01-18: 5 mg via ORAL
  Administered 2018-01-18 – 2018-01-22 (×11): 10 mg via ORAL
  Administered 2018-01-22: 5 mg via ORAL
  Filled 2018-01-14 (×30): qty 2

## 2018-01-14 MED ORDER — ENOXAPARIN SODIUM 40 MG/0.4ML ~~LOC~~ SOLN: 40.0000 mg | SUBCUTANEOUS | Status: DC

## 2018-01-14 MED ORDER — CLONAZEPAM 0.5 MG PO TBDP
0.5000 mg | ORAL_TABLET | Freq: Two times a day (BID) | ORAL | Status: DC | PRN
  Administered 2018-01-15: 0.5 mg via ORAL
  Filled 2018-01-14: qty 1

## 2018-01-14 MED ORDER — ENOXAPARIN SODIUM 40 MG/0.4ML ~~LOC~~ SOLN
40.0000 mg | SUBCUTANEOUS | Status: DC
  Administered 2018-01-15 – 2018-01-21 (×7): 40 mg via SUBCUTANEOUS
  Filled 2018-01-14 (×7): qty 0.4

## 2018-01-14 MED ORDER — DOCUSATE SODIUM 100 MG PO CAPS
100.0000 mg | ORAL_CAPSULE | Freq: Two times a day (BID) | ORAL | Status: DC
  Administered 2018-01-14 – 2018-01-22 (×17): 100 mg via ORAL
  Filled 2018-01-14 (×17): qty 1

## 2018-01-14 MED ORDER — TAMSULOSIN HCL 0.4 MG PO CAPS
0.4000 mg | ORAL_CAPSULE | Freq: Every day | ORAL | Status: DC
  Administered 2018-01-15 – 2018-01-23 (×9): 0.4 mg via ORAL
  Filled 2018-01-14 (×9): qty 1

## 2018-01-14 MED ORDER — FAMOTIDINE 20 MG PO TABS
20.0000 mg | ORAL_TABLET | Freq: Every day | ORAL | Status: DC
  Administered 2018-01-14: 20 mg via ORAL
  Filled 2018-01-14: qty 1

## 2018-01-14 MED ORDER — ENSURE ENLIVE PO LIQD
237.0000 mL | Freq: Two times a day (BID) | ORAL | Status: DC
  Administered 2018-01-15 – 2018-01-23 (×17): 237 mL via ORAL
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
## 2018-01-14 NOTE — Progress Notes (Signed)
Patent transferred to inpatient rehab in stable condition with all belongings and wife at bedside. They both verbalized understanding of plan of care.
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
## 2018-01-14 NOTE — Progress Notes (Signed)
Patient received at approximately 1638. Patient  complaining of pain in abdomen and wanted colostomy changed.colostomy bagged changed per paitent request. Oriented patient and girlfriend to room and call bell system. Patient and GF verbalized understanding of admission process. Abdominal dressing CD&I. Colostomy intact.  [name]
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
## 2018-01-14 NOTE — Discharge Instructions (Signed)
[facility]      [facility] [phone]  OPEN ABDOMINAL SURGERY: POST OP INSTRUCTIONS  Always review your discharge instruction sheet given to you by the facility where your surgery was performed.  IF YOU HAVE DISABILITY OR FAMILY LEAVE FORMS, YOU MUST BRING THEM TO THE OFFICE FOR PROCESSING.  PLEASE DO NOT GIVE THEM TO YOUR DOCTOR.  1. A prescription for pain medication may be given to you upon discharge.  Take your pain medication as prescribed, if needed.  If narcotic pain medicine is not needed, then you may take acetaminophen (Tylenol) or ibuprofen (Advil) as needed. 2. Take your usually prescribed medications unless otherwise directed. 3. If you need a refill on your pain medication, please contact your pharmacy. They will contact our office to request authorization.  Prescriptions will not be filled after 5pm or on week-ends. 4. You should follow a light diet the first few days after arrival home, such as soup and crackers, pudding, etc.unless your doctor has advised otherwise. A high-fiber, low fat diet can be resumed as tolerated.   Be sure to include lots of fluids daily. Most patients will experience some swelling and bruising on the chest and neck area.  Ice packs will help.  Swelling and bruising can take several days to resolve 5. Most patients will experience some swelling and bruising in the area of the incision. Ice pack will help. Swelling and bruising can take several days to resolve..  6. It is common to experience some constipation if taking pain medication after surgery.  Increasing fluid intake and taking a stool softener will usually help or prevent this problem from occurring.  A mild laxative (Milk of Magnesia or Miralax) should be taken according to package directions if there are no bowel movements after 48 hours. 7.  You may have steri-strips (small skin tapes) in place directly over the incision.  These strips should be left on the skin for 7-10 days.  If your  surgeon used skin glue on the incision, you may shower in 24 hours.  The glue will flake off over the next 2-3 weeks.  Any sutures or staples will be removed at the office during your follow-up visit. You may find that a light gauze bandage over your incision may keep your staples from being rubbed or pulled. You may shower and replace the bandage daily. 8. ACTIVITIES:  You may resume regular (light) daily activities beginning the next day--such as daily self-care, walking, climbing stairs--gradually increasing activities as tolerated.  You may have sexual intercourse when it is comfortable.  Refrain from any heavy lifting or straining until approved by your doctor. a. You may drive when you no longer are taking prescription pain medication, you can comfortably wear a seatbelt, and you can safely maneuver your car and apply brakes b. Return to Work: ___________________________________ 9. You should see your doctor in the office for a follow-up appointment approximately two weeks after your surgery.  Make sure that you call for this appointment within a day or two after you arrive home to insure a convenient appointment time. OTHER INSTRUCTIONS:  _____________________________________________________________ _____________________________________________________________  WHEN TO CALL YOUR DOCTOR: 1. Fever over 101.0 2. Inability to urinate 3. Nausea and/or vomiting 4. Extreme swelling or bruising 5. Continued bleeding from incision. 6. Increased pain, redness, or drainage from the incision. 7. Difficulty swallowing or breathing 8. Muscle cramping or spasms. 9. Numbness or tingling in hands or feet or around lips.  The clinic staff is available to   answer your questions during regular business hours.  Please don't hesitate to call and ask to speak to one of the nurses if you have concerns.  For further questions, please visit [email or web address]   
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
## 2018-01-14 NOTE — Progress Notes (Signed)
Inpatient Rehabilitation-Admissions Coordinator   Spoke with pt and wife today regarding dispo. Both have agreed to CIR for rehab, acknowledging potential costs. AC has received medical approval and CIR has a bed available for this pt. Pt to be admitted to CIR today. AC has updated floor RN, CM, and SW. Please call if questions.   Jhonnie Garner, OTR/L  Rehab Admissions Coordinator  [phone] 01/14/2018 3:47 PM

## 2018-01-15 ENCOUNTER — Inpatient Hospital Stay (HOSPITAL_COMMUNITY): Payer: Self-pay | Admitting: Physical Therapy

## 2018-01-15 ENCOUNTER — Inpatient Hospital Stay (HOSPITAL_COMMUNITY): Payer: Self-pay

## 2018-01-15 ENCOUNTER — Inpatient Hospital Stay (HOSPITAL_COMMUNITY): Payer: Self-pay | Admitting: Occupational Therapy

## 2018-01-15 DIAGNOSIS — R109 Unspecified abdominal pain: Secondary | ICD-10-CM

## 2018-01-15 DIAGNOSIS — W3400XA Accidental discharge from unspecified firearms or gun, initial encounter: Secondary | ICD-10-CM

## 2018-01-15 DIAGNOSIS — R5381 Other malaise: Secondary | ICD-10-CM

## 2018-01-15 DIAGNOSIS — E871 Hypo-osmolality and hyponatremia: Secondary | ICD-10-CM

## 2018-01-15 DIAGNOSIS — D62 Acute posthemorrhagic anemia: Secondary | ICD-10-CM

## 2018-01-15 DIAGNOSIS — D72829 Elevated white blood cell count, unspecified: Secondary | ICD-10-CM

## 2018-01-15 LAB — CBC WITH DIFFERENTIAL/PLATELET
Basophils Absolute: 0 10*3/uL (ref 0.0–0.1)
Basophils Relative: 0 %
EOS PCT: 2 %
Eosinophils Absolute: 0.5 10*3/uL (ref 0.0–0.7)
HEMATOCRIT: 28.2 % — AB (ref 39.0–52.0)
Hemoglobin: 8.7 g/dL — ABNORMAL LOW (ref 13.0–17.0)
LYMPHS ABS: 2.9 10*3/uL (ref 0.7–4.0)
Lymphocytes Relative: 11 %
MCH: 25.6 pg — AB (ref 26.0–34.0)
MCHC: 30.9 g/dL (ref 30.0–36.0)
MCV: 82.9 fL (ref 78.0–100.0)
MONO ABS: 1.8 10*3/uL — AB (ref 0.1–1.0)
MONOS PCT: 7 %
NEUTROS PCT: 80 %
Neutro Abs: 21.1 10*3/uL — ABNORMAL HIGH (ref 1.7–7.7)
PLATELETS: 799 10*3/uL — AB (ref 150–400)
RBC: 3.4 MIL/uL — ABNORMAL LOW (ref 4.22–5.81)
RDW: 15 % (ref 11.5–15.5)
WBC: 26.3 10*3/uL — ABNORMAL HIGH (ref 4.0–10.5)

## 2018-01-15 LAB — URINALYSIS, COMPLETE (UACMP) WITH MICROSCOPIC
BILIRUBIN URINE: NEGATIVE
Glucose, UA: NEGATIVE mg/dL
HGB URINE DIPSTICK: NEGATIVE
Ketones, ur: NEGATIVE mg/dL
NITRITE: NEGATIVE
PH: 6 (ref 5.0–8.0)
Protein, ur: NEGATIVE mg/dL
SPECIFIC GRAVITY, URINE: 1.017 (ref 1.005–1.030)

## 2018-01-15 LAB — COMPREHENSIVE METABOLIC PANEL
ALT: 20 U/L (ref 0–44)
AST: 32 U/L (ref 15–41)
Albumin: 2.6 g/dL — ABNORMAL LOW (ref 3.5–5.0)
Alkaline Phosphatase: 78 U/L (ref 38–126)
Anion gap: 9 (ref 5–15)
BILIRUBIN TOTAL: 0.3 mg/dL (ref 0.3–1.2)
BUN: 5 mg/dL — ABNORMAL LOW (ref 6–20)
CALCIUM: 8.8 mg/dL — AB (ref 8.9–10.3)
CHLORIDE: 98 mmol/L (ref 98–111)
CO2: 26 mmol/L (ref 22–32)
CREATININE: 0.74 mg/dL (ref 0.61–1.24)
Glucose, Bld: 107 mg/dL — ABNORMAL HIGH (ref 70–99)
Potassium: 3.7 mmol/L (ref 3.5–5.1)
Sodium: 133 mmol/L — ABNORMAL LOW (ref 135–145)
TOTAL PROTEIN: 6.8 g/dL (ref 6.5–8.1)
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
## 2018-01-15 NOTE — Progress Notes (Signed)
Occupational Therapy Session Note  Patient Details  Name: [name] MRN: [id number] Date of Birth: 06-17-1971  Today's Date: 01/15/2018 OT Individual Time: 1300-1350 OT Individual Time Calculation (min): 50 min  and Today's Date: 01/15/2018 OT Missed Time: 10 Minutes Missed Time Reason: Patient fatigue;Pain   Short Term Goals: Week 1:  OT Short Term Goal 1 (Week 1): Pt will complete stand pivot transfer to toilet with LRAD and min A OT Short Term Goal 2 (Week 1): Pt will don pants with steadying assist using AD PRN OT Short Term Goal 3 (Week 1): Pt will don shirt with set-up assist OT Short Term Goal 4 (Week 1): Pt will recall technique for stand pivot transfer with min cuing  Skilled Therapeutic Interventions/Progress Updates:    Upon entering the room, pt supine in bed with wife, [name], present. Pt with c/o 5/10 pain in R LE. OT educating pt and caregiver again on OT purpose, POC, and goals. Pt's wife wishing to be very involved in treatment sessions. She insisted RN change abdominal dressing during this session. OT providing education regarding pain management and relaxation techniques during this task. Pt becoming very anxious while RN changing dressing and having a hard time with his current situation. Pt very tearful as well. OT provided sound machine for relaxation technique with pt calming some and OT leaving pt in bed with wife present to continue to relax as RN arrived with anxiety medication. 10 missed minutes of OT intervention.   Therapy Documentation Precautions:  Restrictions Weight Bearing Restrictions: Yes LLE Weight Bearing: Weight bearing as tolerated General: General OT Amount of Missed Time: 10 Minutes  See Function Navigator for Current Functional Status.   Therapy/Group: Individual Therapy  Gypsy Decant 01/15/2018, 1:51 PM

## 2018-01-15 NOTE — Progress Notes (Signed)
Vascular study identifies acute DVT intramuscular gastrocnemius of the right lower extremity.  Patient currently maintained on Lovenox 40 mg daily.  We will continue to monitor and follow-up vascular study

## 2018-01-15 NOTE — Consult Note (Signed)
WOC consulted for wound care and ostomy care.  See previous [place] consultation notes. Patient with wife are independent in ostomy care and should be supported to continue with pouch changes 2x week.    Wound care was updated in the computer after verification that NPWT dressing was DCed on Saturday 01/13/18 per surgical team.  [facility] nurse will follow up weekly with patient and spouse for support with ostomy.  [place], [place], [place]

## 2018-01-15 NOTE — Progress Notes (Signed)
Bilateral lower extremity venous duplex has been completed. There is evidence of acute deep vein thrombosis involving the intramuscular gastrocnemius veins of the right lower extremity. Negative for DVT on the left.  Results were given to the patient's nurse, Seth Bake.   01/15/18 12:34 PM [name] RVT

## 2018-01-16 ENCOUNTER — Inpatient Hospital Stay (HOSPITAL_COMMUNITY): Payer: Self-pay | Admitting: Occupational Therapy

## 2018-01-16 ENCOUNTER — Encounter (HOSPITAL_COMMUNITY): Payer: Self-pay | Admitting: Psychology

## 2018-01-16 ENCOUNTER — Inpatient Hospital Stay (HOSPITAL_COMMUNITY): Payer: Self-pay | Admitting: Physical Therapy

## 2018-01-16 ENCOUNTER — Inpatient Hospital Stay (HOSPITAL_COMMUNITY): Payer: Self-pay | Admitting: Speech Pathology

## 2018-01-16 ENCOUNTER — Inpatient Hospital Stay (HOSPITAL_COMMUNITY): Payer: Self-pay

## 2018-01-16 DIAGNOSIS — I824Z1 Acute embolism and thrombosis of unspecified deep veins of right distal lower extremity: Secondary | ICD-10-CM

## 2018-01-16 DIAGNOSIS — N390 Urinary tract infection, site not specified: Secondary | ICD-10-CM

## 2018-01-16 DIAGNOSIS — R339 Retention of urine, unspecified: Secondary | ICD-10-CM

## 2018-01-16 LAB — URINE CULTURE: Culture: <10000 — AB

## 2018-01-16 MED ORDER — CLONAZEPAM 0.5 MG PO TBDP
0.5000 mg | ORAL_TABLET | Freq: Two times a day (BID) | ORAL | Status: DC | PRN
  Administered 2018-01-16: 0.5 mg via ORAL
  Filled 2018-01-16: qty 1

## 2018-01-16 MED ORDER — NITROFURANTOIN MONOHYD MACRO 100 MG PO CAPS
100.0000 mg | ORAL_CAPSULE | Freq: Two times a day (BID) | ORAL | Status: AC
  Administered 2018-01-16 – 2018-01-22 (×14): 100 mg via ORAL
  Filled 2018-01-16 (×14): qty 1

## 2018-01-16 MED ORDER — CLONAZEPAM 0.5 MG PO TBDP
0.5000 mg | ORAL_TABLET | Freq: Two times a day (BID) | ORAL | Status: DC | PRN
  Administered 2018-01-16: 0.5 mg via ORAL
  Filled 2018-01-16 (×2): qty 1

## 2018-01-16 NOTE — Progress Notes (Signed)
Inpatient Rehabilitation Center Individual Statement of Services  Patient Name:  [name]  Date:  01/16/2018  Welcome to the [facility].  Our goal is to provide you with an individualized program based on your diagnosis and situation, designed to meet your specific needs.  With this comprehensive rehabilitation program, you will be expected to participate in at least 3 hours of rehabilitation therapies Monday-Friday, with modified therapy programming on the weekends.  Your rehabilitation program will include the following services:  Physical Therapy (PT), Occupational Therapy (OT), Speech Therapy (ST), 24 hour per day rehabilitation nursing, Neuropsychology, Case Management (Social Worker), Rehabilitation Medicine, Nutrition Services and Pharmacy Services  Weekly team conferences will be held on Wednesdays to discuss your progress.  Your Social Worker will talk with you frequently to get your input and to update you on team discussions.  Team conferences with you and your family in attendance may also be held.  Expected length of stay:  10 to 14 days  Overall anticipated outcome:  Supervision to minimal assistance  Depending on your progress and recovery, your program may change. Your Social Worker will coordinate services and will keep you informed of any changes. Your Social Worker's name and contact numbers are listed  below.  The following services may also be recommended but are not provided by the [facility] will be made to provide these services after discharge if needed.  Arrangements include referral to agencies that provide these services.  Your insurance has been verified to be:  None at this time, but you have already applied for Medicaid. Your primary doctor is:  You do not have one right now.   We recommend your getting connected with [facility] in [place].  Sonia Baller gave you application packet to complete.  This needs to be back at their office before they will schedule an appointment.  Pertinent information will be shared with your doctor and your insurance company.  Social Worker:  Alfonse Alpers, LCSW  [phone] or ([phone]  Information discussed with and copy given to patient by: Trey Sailors, 01/16/2018, 11:31 AM

## 2018-01-16 NOTE — Consult Note (Addendum)
[place] Nurse ostomy follow up Stoma type/location: LUQ colostomy Stomal assessment/size: oval 1 1/2" wide, 1" top to bottom. Stoma has several elevated areas, it is above skin level, has brown slough on two of the elevated areas. Peristomal assessment: intact Treatment options for stomal/peristomal skin: skin barrier ring Output there was no output in current bag.  Ostomy pouching: 2pc.2 3/4" Education provided: Made new pattern as the one patient was using was cut in a circle and the ostomy is oval.  Enrolled patient in [place] Start Discharge program: Previously [place] team will check in with patient and his wife weekly to address any needs. Wife is independent in changing pouch. Fara Olden, RN-C, WTA-C, [place] Wound Treatment Associate Ostomy Care Associate

## 2018-01-16 NOTE — Progress Notes (Signed)
Occupational Therapy Session Note  Patient Details  Name: [name] MRN: [id number] Date of Birth: [birth year]  Today's Date: 01/16/2018 OT Individual Time: 1300-1400 OT Individual Time Calculation (min): 60 min    Short Term Goals: Week 1:  OT Short Term Goal 1 (Week 1): Pt will complete stand pivot transfer to toilet with LRAD and min A OT Short Term Goal 2 (Week 1): Pt will don pants with steadying assist using AD PRN OT Short Term Goal 3 (Week 1): Pt will don shirt with set-up assist OT Short Term Goal 4 (Week 1): Pt will recall technique for stand pivot transfer with min cuing  Skilled Therapeutic Interventions/Progress Updates:    Pt seen for OT session focusing on functional transfers, functional standing balance, and education. Pt in supine upon arrival, talking on phone. Pt demonstrated no word finding deficits when speaking on phone, however, more difficulties with word finding when communicating with therapist.  He transferred to EOB with increased time using hospital bed functions. Throughout session pt completed squat pivot and stand pivot transfers with guarding assist, VCs for hand placement. Extensive education and discussion regarding self management of ostomy bag and planning for toilet transfers in order to empty ostomy into toilet. Pt reports he will get ostomy removed hopefully before d/c and if not, will remain at home until otstomy is removed and wife will assist with management of ostomy from home. Pt willing to attmept toilet transfer and completed with use of grab bars via stand pivot. He self propelled w/c throughout unit with supervision/ cuing. With encouragement, participated in pt July 4th activities, standing at [facility] to toss bean bags. Pt able to complete sit>stand at supervision level and steadying assist for dynamic balance. Pt cont to be limited by pain during dynamic movements. Pt smiling and laughing throughout session and stated he had a good time and glad he  went. Pt returned to room at end of session, set-up with lunch tray and all needs in reach.   Therapy Documentation Precautions:  Precautions Precautions: Fall Precaution Comments: ostomy bag, large abdominal wound Required Braces or Orthoses: Other Brace/Splint Other Brace/Splint: PRN L hinge brace for longer distance/pt comfort Restrictions Weight Bearing Restrictions: Yes RLE Weight Bearing: Weight bearing as tolerated LLE Weight Bearing: Weight bearing as tolerated  See Function Navigator for Current Functional Status.   Therapy/Group: Individual Therapy  Alan Riles L 01/16/2018, 7:24 AM

## 2018-01-16 NOTE — Progress Notes (Signed)
Physical Therapy Session Note  Patient Details  Name: [name] MRN: [id number] Date of Birth: [birth year]  Today's Date: 01/16/2018 PT Individual Time: 1000-1100 PT Individual Time Calculation (min): 60 min   Short Term Goals: Week 1:  PT Short Term Goal 1 (Week 1): Pt will initiate stair training for strengthening.  PT Short Term Goal 2 (Week 1): Pt will ambulate 35 ft with LRAD & min assist. PT Short Term Goal 3 (Week 1): Pt will complete sit>stand transfers with mod assist.  Skilled Therapeutic Interventions/Progress Updates:    Pt seated in w/c upon PT arrival, agreeable to therapy tx and reports pain 6/10 in R LE, back and abdomen. Pt propelled w/c to the gym using B UEs and with supervision. Pt performed lateral scoot transfer to the mat with min assist and verbal cues for techniques. Pt performed posterior scoot and transfer to supine with supervision while therapist donned L knee hinged brace for pt comfort. Pt with increased anxiety throughout session but also very eager to try things. Pt ambulated x 20 ft with RW and min assist, verbal cues for decreased reliance on UEs, limited by R ankle pain, w/c follow for safety. Pt eager and asking to walk in parallel bars, pt ambulated x8 ft forwards/backwards within parallel bars x2 with min assist. Pt performed seated therex including 2 x 10 LAQ, 2 x 10 hip flexion and ankle pumps bilaterally. Pt propelled w/c back to room and performed lateral scoot to bed with min assist. Pt transferred to supine with min assist. Left supine with needs in reach and bed alarm set.   Therapy Documentation Precautions:  Precautions Precautions: Fall Precaution Comments: ostomy bag, large abdominal wound Required Braces or Orthoses: Other Brace/Splint Other Brace/Splint: PRN L hinge brace for longer distance/pt comfort Restrictions Weight Bearing Restrictions: Yes RLE Weight Bearing: Weight bearing as tolerated LLE Weight Bearing: Weight bearing as  tolerated   See Function Navigator for Current Functional Status.   Therapy/Group: Individual Therapy  Netta Corrigan, PT, DPT 01/16/2018, 7:53 AM
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
## 2018-01-17 ENCOUNTER — Inpatient Hospital Stay (HOSPITAL_COMMUNITY): Payer: Self-pay | Admitting: Occupational Therapy

## 2018-01-17 ENCOUNTER — Inpatient Hospital Stay (HOSPITAL_COMMUNITY): Payer: Self-pay | Admitting: Speech Pathology

## 2018-01-17 ENCOUNTER — Inpatient Hospital Stay (HOSPITAL_COMMUNITY): Payer: Self-pay | Admitting: Physical Therapy

## 2018-01-17 DIAGNOSIS — F4310 Post-traumatic stress disorder, unspecified: Secondary | ICD-10-CM

## 2018-01-17 MED ORDER — CLONAZEPAM 0.5 MG PO TBDP
0.5000 mg | ORAL_TABLET | Freq: Two times a day (BID) | ORAL | Status: DC
  Administered 2018-01-17 – 2018-01-23 (×13): 0.5 mg via ORAL
  Filled 2018-01-17 (×12): qty 1

## 2018-01-17 MED ORDER — FLUOXETINE HCL 10 MG PO CAPS
10.0000 mg | ORAL_CAPSULE | Freq: Every day | ORAL | Status: DC
Start: 2018-01-17 — End: 2018-01-23
  Administered 2018-01-17 – 2018-01-23 (×7): 10 mg via ORAL
  Filled 2018-01-17 (×7): qty 1
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
## 2018-01-17 NOTE — Plan of Care (Signed)
  Problem: Consults Goal: RH GENERAL PATIENT EDUCATION Description See Patient Education module for education specifics. Outcome: Progressing   Problem: RH BOWEL ELIMINATION Goal: RH STG MANAGE BOWEL WITH ASSISTANCE Description STG Manage Bowel with mod Assistance.  Outcome: Progressing   Problem: RH SKIN INTEGRITY Goal: RH STG SKIN FREE OF INFECTION/BREAKDOWN Description Current wound healing and no new skin breakdown/infection with min assistance  Outcome: Progressing Goal: RH STG MAINTAIN SKIN INTEGRITY WITH ASSISTANCE Description STG Maintain Skin Integrity With mod Assistance.  Outcome: Progressing Goal: RH STG ABLE TO PERFORM INCISION/WOUND CARE W/ASSISTANCE Description STG Able To Perform Incision/Wound Care With mod Assistance.  Outcome: Progressing   Problem: RH SAFETY Goal: RH STG ADHERE TO SAFETY PRECAUTIONS W/ASSISTANCE/DEVICE Description STG Adhere to Safety Precautions With min Assistance/Device.  Outcome: Progressing   Problem: RH KNOWLEDGE DEFICIT GENERAL Goal: RH STG INCREASE KNOWLEDGE OF SELF CARE AFTER HOSPITALIZATION Description Patient able to verbalize/ demonstrate care of colostomy and dressing change of abdominal wound with mod assistance  Outcome: Progressing   Problem: RH PAIN MANAGEMENT Goal: RH STG PAIN MANAGED AT OR BELOW PT'S PAIN GOAL Description Pain < or = 5 with mod assistance  Outcome: Not Progressing  Pt c/o pain 6/10 or greater. PRN oxy 10 mg given Q4H prn. Tylenol scheduled given. Encouraged relaxing techniques and deep breathing to decrease pain level. Will cont to monitor.   Pamella Pert, LPN

## 2018-01-17 NOTE — Progress Notes (Signed)
Speech Language Pathology Daily Session Note  Patient Details  Name: [name] MRN: [id number] Date of Birth: [birth year]  Today's Date: 01/17/2018 SLP Individual Time: 0800-0900 SLP Individual Time Calculation (min): 60 min  Short Term Goals: Week 1: SLP Short Term Goal 1 (Week 1): Pt will use external aids to facilitate recall of daily information with min assist verbal cues.   SLP Short Term Goal 2 (Week 1): Pt will complete mildly complex ADLs with min cues for sequencing, self monitoring, and correcting.   SLP Short Term Goal 3 (Week 1): Pt will return demonstration of at least 2 safety precautions with min verbal cues.   Skilled Therapeutic Interventions:  Pt was seen for skilled ST targeting cognitive goals.  Pt was much brighter today in comparison to yesterday's evaluation.  He was smiling, laughing, and making jokes with therapist appropriately.  He verbalized decreased anxiety today and expressed a desire to think more positively about his rehab experience.  Pt had recorded information into his memory notebook since yesterday and demonstrated understanding of how to use memory notebook multiple times throughout therapy session with mod I.  SLP facilitated the session with a novel card game targeting use of memory compensatory strategies, specifically word-picture associations.  Pt was able to generate and recall associations with supervision.  He could then generate and recall 100% of specifically named category members from task with only 1 min assist verbal cue.  Pt was returned to his room and left in wheelchair with wife at bedside.  Continue per current plan of care.    Function:  Eating Eating                 Cognition Comprehension Comprehension assist level: Follows complex conversation/direction with extra time/assistive device  Expression   Expression assist level: Expresses complex ideas: With extra time/assistive device  Social Interaction Social Interaction  assist level: Interacts appropriately with others with medication or extra time (anti-anxiety, antidepressant).  Problem Solving Problem solving assist level: Solves basic 90% of the time/requires cueing < 10% of the time  Memory Memory assist level: Recognizes or recalls 75 - 89% of the time/requires cueing 10 - 24% of the time    Pain Pain Assessment Pain Scale: 0-10 Pain Score: 0-No pain   Therapy/Group: Individual Therapy  Adraine Biffle, Selinda Orion 01/17/2018, 12:19 PM

## 2018-01-18 ENCOUNTER — Inpatient Hospital Stay (HOSPITAL_COMMUNITY): Payer: Self-pay | Admitting: Physical Therapy

## 2018-01-18 ENCOUNTER — Inpatient Hospital Stay (HOSPITAL_COMMUNITY): Payer: Self-pay | Admitting: Occupational Therapy

## 2018-01-18 ENCOUNTER — Inpatient Hospital Stay (HOSPITAL_COMMUNITY): Payer: Self-pay | Admitting: Speech Pathology

## 2018-01-18 ENCOUNTER — Encounter (HOSPITAL_COMMUNITY): Payer: Self-pay

## 2018-01-18 DIAGNOSIS — F411 Generalized anxiety disorder: Secondary | ICD-10-CM

## 2018-01-18 LAB — CBC WITH DIFFERENTIAL/PLATELET
ABS IMMATURE GRANULOCYTES: 0.2 10*3/uL — AB (ref 0.0–0.1)
Basophils Absolute: 0.1 10*3/uL (ref 0.0–0.1)
Basophils Relative: 1 %
Eosinophils Absolute: 0.7 10*3/uL (ref 0.0–0.7)
Eosinophils Relative: 5 %
HEMATOCRIT: 30 % — AB (ref 39.0–52.0)
HEMOGLOBIN: 9.2 g/dL — AB (ref 13.0–17.0)
IMMATURE GRANULOCYTES: 1 %
LYMPHS ABS: 3.2 10*3/uL (ref 0.7–4.0)
LYMPHS PCT: 23 %
MCH: 25.1 pg — ABNORMAL LOW (ref 26.0–34.0)
MCHC: 30.7 g/dL (ref 30.0–36.0)
MCV: 82 fL (ref 78.0–100.0)
Monocytes Absolute: 1 10*3/uL (ref 0.1–1.0)
Monocytes Relative: 7 %
NEUTROS PCT: 63 %
Neutro Abs: 8.6 10*3/uL — ABNORMAL HIGH (ref 1.7–7.7)
Platelets: 825 10*3/uL — ABNORMAL HIGH (ref 150–400)
RBC: 3.66 MIL/uL — AB (ref 4.22–5.81)
RDW: 14.9 % (ref 11.5–15.5)
WBC: 13.8 10*3/uL — AB (ref 4.0–10.5)

## 2018-01-18 LAB — BASIC METABOLIC PANEL
Anion gap: 10 (ref 5–15)
BUN: 10 mg/dL (ref 6–20)
CHLORIDE: 97 mmol/L — AB (ref 98–111)
CO2: 27 mmol/L (ref 22–32)
CREATININE: 0.73 mg/dL (ref 0.61–1.24)
Calcium: 9.5 mg/dL (ref 8.9–10.3)
GFR calc non Af Amer: >60 mL/min (ref >60)
Glucose, Bld: 103 mg/dL — ABNORMAL HIGH (ref 70–99)
Potassium: 4.2 mmol/L (ref 3.5–5.1)
SODIUM: 134 mmol/L — AB (ref 135–145)
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
## 2018-01-18 NOTE — Progress Notes (Signed)
After speaking with social worker regarding earlier discharge becomes sad. Tearful not wanting to go home early. Wife reassures t6hat they will do fine. Refuses request for chaplin or anyone to diwscuss conerns with.

## 2018-01-18 NOTE — Progress Notes (Signed)
Occupational Therapy Session Note  Patient Details  Name: [name] MRN: [id number] Date of Birth: 1971-01-17  Today's Date: 01/18/2018 OT Individual Time: 1694-5038 OT Individual Time Calculation (min): 45 min  and Today's Date: 01/18/2018 OT Missed Time: 15 Minutes Missed Time Reason: Nursing care   Short Term Goals: Week 1:  OT Short Term Goal 1 (Week 1): Pt will complete stand pivot transfer to toilet with LRAD and min A OT Short Term Goal 2 (Week 1): Pt will don pants with steadying assist using AD PRN OT Short Term Goal 3 (Week 1): Pt will don shirt with set-up assist OT Short Term Goal 4 (Week 1): Pt will recall technique for stand pivot transfer with min cuing  Skilled Therapeutic Interventions/Progress Updates:    Pt seen for OT session focusing on functional transfers. Pt sitting up in w/c upon arrival, agreeable to tx session and denying pain.  Discussed d/c planning with potential moving up of d/c date due to pt progress. Pt with increased anxiety when discussing this option as he has ride set for d/c home and appointments. Attempted to provide education and support, however, due to increase in anxiety conversation was unsuccessful. Spoke with CSW regarding this after session as pt is near goal level for PT and OT of supervision overall.  Discussed home bathroom layout, pt reports having shower stall and tub/shower options at apartment. Therefore,  In ADL apartment completed simulated  tub transfer bench and walk in shower transfer. Supervision for tub bench following demonstration and VCs for technique. CGA for shower stall transfer though pt required lots of encouragement and cuing for shower stall transfer using RW. Pt in agreement to use tub transfer bench at d/c for increased safety and independence with transfer.  Following transfer, pt noted abdominal dressing coming undone, beginning to perseverate about infection control and need for bandage to be fixed by nursing. He  refused walking back to room as preferring w/c to get him there quicker. Pt self propelled w/c back to room, left seated in w/c with RN present to change dressing. Pt missed 15 minutes of skilled tx due to wound care.   Therapy Documentation Precautions:  Precautions Precautions: Fall Precaution Comments: ostomy bag, large abdominal wound Required Braces or Orthoses: Other Brace/Splint Other Brace/Splint: PRN L hinge brace for longer distance/pt comfort Restrictions Weight Bearing Restrictions: Yes RLE Weight Bearing: Weight bearing as tolerated LLE Weight Bearing: Weight bearing as tolerated  See Function Navigator for Current Functional Status.   Therapy/Group: Individual Therapy  Tamiyah Moulin L 01/18/2018, 6:38 AM

## 2018-01-18 NOTE — Progress Notes (Signed)
Speech Language Pathology Daily Session Note  Patient Details  Name: [name] MRN: [id number] Date of Birth: [birth year]  Today's Date: 01/18/2018 SLP Individual Time: 1572-6203 SLP Individual Time Calculation (min): 48 min  Short Term Goals: Week 1: SLP Short Term Goal 1 (Week 1): Pt will use external aids to facilitate recall of daily information with min assist verbal cues.   SLP Short Term Goal 2 (Week 1): Pt will complete mildly complex ADLs with min cues for sequencing, self monitoring, and correcting.   SLP Short Term Goal 3 (Week 1): Pt will return demonstration of at least 2 safety precautions with min verbal cues.   Skilled Therapeutic Interventions:  Pt was seen for skilled ST targeting cognitive goals.  Pt was up, in wheelchair, dressed, and finishing breakfast upon therapist's arrival.  SLP facilitated the session with a novel card game targeting problem solving and recall goals.  Pt initially needed min assist to plan and execute a problem solving strategy within task; however, as pt became more familiar with rules and procedures therapist was able to fade cues to mod I.  Pt was having slightly increased anxiety today in comparison to yesterday's therapy session; therefore, SLP provided skilled education regarding identifying physiological signs of anxiety (fists clenched, tension in shoulders, neck, or face, fidgeting, teeth clenched) and coping strategies such as stopping and taking a break, deep breathing, or listening to music.  SLP also discussed the connection between anxiety and the ability to process and store information as pt continues to verbalize fluctuating ability to remember daily information.  Pt verbalized understanding and all questions were answered to his satisfaction at this time.  Pt returned to room and left in wheelchair with call bell within reach.  Wife at bedside and was updated regarding education provided during treatment session.  Continue per current plan  of care.    Function:  Eating Eating                 Cognition Comprehension Comprehension assist level: Follows complex conversation/direction with extra time/assistive device  Expression   Expression assist level: Expresses complex ideas: With extra time/assistive device  Social Interaction Social Interaction assist level: Interacts appropriately with others with medication or extra time (anti-anxiety, antidepressant).  Problem Solving Problem solving assist level: Solves basic 90% of the time/requires cueing < 10% of the time  Memory Memory assist level: Recognizes or recalls 75 - 89% of the time/requires cueing 10 - 24% of the time    Pain Pain Assessment Pain Scale: 0-10 Pain Score: 0-No pain  Therapy/Group: Individual Therapy  Dshaun Reppucci, Selinda Orion 01/18/2018, 12:33 PM

## 2018-01-18 NOTE — Progress Notes (Signed)
Physical Therapy Session Note  Patient Details  Name: [name] MRN: [id number] Date of Birth: 1970-10-05  Today's Date: 01/18/2018 PT Individual Time: 1100-1155 and 1500-1530 PT Individual Time Calculation (min): 55 min and 30 min (total 85 min)   Short Term Goals: Week 1:  PT Short Term Goal 1 (Week 1): Pt will initiate stair training for strengthening.  PT Short Term Goal 2 (Week 1): Pt will ambulate 35 ft with LRAD & min assist. PT Short Term Goal 3 (Week 1): Pt will complete sit>stand transfers with mod assist.  Skilled Therapeutic Interventions/Progress Updates: Tx 1: Pt received supine in bed, denies pain at rest and agreeable to treatment. Supine>sit with S. Dons B shoes with setupA. Ambulates in/out of bathroom with RW and close S; standing balance with S while pt manages emptying ostomy bag with setupA. W/c propulsion 2x175' with BUE and S; cues for technique to reduce friction when sliding hands back to start position for each push. Gait in ADL apartment on carpet with RW and min guard. L knee brace adjusted for better fit. Performed transfer on/off recliner with minA d/t low height and chair rocking. Sit <>supine on flat bed with S and increased time. Pt and wife report their bed is much higher. Returned to room and elevated hospital bed to approx 23ft to height pt and wife report their bed is. Performed stand pivot to/from elevated bed with RW and 4" aerobic step with min guard, min cues for technique and safety. Pt reports they have a smaller width/depth step, that is a few inches taller. Recommended pt trial with family member beside him to ensure safety upon return home. Returned to w/c at end of session; all needs in reach.   Tx 2: Pt received in w/c on the phone, then nursing staff present assessing vitals and administering medications; pt missed 15 min PT. Upon therapist's return, pt agreeable to treatment. Discussed moving up d/c date with CSW after confirmation with MD to next  Wednesday; pt appears to be more accepting of new date after several discussions with staff and wife. W/c propulsion BUE x150'. Stand pivot transfer no AD w/c <>nustep with S. BUE/BLE nustep x8 min total, level 7 with average 45 steps/min for focus on aerobic endurance, strengthening and LE ROM. Returned to room w/c propulsion as above; remained seated in w/c at end of session, all needs in reach.      Therapy Documentation Precautions:  Precautions Precautions: Fall Precaution Comments: ostomy bag, large abdominal wound Required Braces or Orthoses: Other Brace/Splint Other Brace/Splint: PRN L hinge brace for longer distance/pt comfort Restrictions Weight Bearing Restrictions: Yes RLE Weight Bearing: Weight bearing as tolerated LLE Weight Bearing: Weight bearing as tolerated General: PT Amount of Missed Time (min): 15 Minutes PT Missed Treatment Reason: Nursing care   See Function Navigator for Current Functional Status.   Therapy/Group: Individual Therapy  [name] 01/18/2018, 11:57 AM

## 2018-01-19 ENCOUNTER — Inpatient Hospital Stay (HOSPITAL_COMMUNITY): Payer: Self-pay | Admitting: Speech Pathology

## 2018-01-19 ENCOUNTER — Inpatient Hospital Stay (HOSPITAL_COMMUNITY): Payer: Self-pay | Admitting: Physical Therapy

## 2018-01-19 ENCOUNTER — Inpatient Hospital Stay (HOSPITAL_COMMUNITY): Payer: Self-pay

## 2018-01-19 ENCOUNTER — Encounter (HOSPITAL_COMMUNITY): Payer: Self-pay

## 2018-01-19 NOTE — Progress Notes (Signed)
Physical Therapy Session Note  Patient Details  Name: [name] MRN: [id number] Date of Birth: 03/04/1971  Today's Date: 01/19/2018 PT Individual Time: 6294-7654 PT Individual Time Calculation (min): 75 min   Short Term Goals: Week 1:  PT Short Term Goal 1 (Week 1): Pt will initiate stair training for strengthening.  PT Short Term Goal 2 (Week 1): Pt will ambulate 35 ft with LRAD & min assist. PT Short Term Goal 3 (Week 1): Pt will complete sit>stand transfers with mod assist.  Skilled Therapeutic Interventions/Progress Updates: Pt received seated in w/c, c/o pain as below and agreeable to treatment. Pt completing lower body dressing with assist from wife; requires cues to lock w/c prior to standing to pull up pants. W/c propulsion x150' modI for UE strengthening. Ascent/descent 12 steps with B handrails and step-to pattern with S, cues for technique for correct lead LE which discussed might depend on the day if R foot or L knee is bothering him more. Gait forward x25', backward x25' with R hall rail and min guard/close S. Second trial forward x75' with rail, min guard. Side stepping x25' each direction for focus on hip abduction strengthening; pt very fearful of side stepping and hesitant to initiate, however able to perform with S and slow speed. Stand pivot transfer no AD and min guard to mat table. Seated bicep curls 10# dumbbells BUE 3x10 reps. Discussed return to exercise with patient as a long term goal for continued strengthening and stress relief. Sit >supine with S and increased time d/t pain. Attempted supine chest press with 10# weights however unable, performed x5 reps with 5# weights before reporting limited by L shoulder. Returned to room modI w/c propulsion; remained seated in w/c with wife present, all needs in reach at completion of session.      Therapy Documentation Precautions:  Precautions Precautions: Fall Precaution Comments: ostomy bag, large abdominal wound Required  Braces or Orthoses: Other Brace/Splint Other Brace/Splint: PRN L hinge brace for longer distance/pt comfort Restrictions Weight Bearing Restrictions: Yes RLE Weight Bearing: Weight bearing as tolerated LLE Weight Bearing: Weight bearing as tolerated Pain: Pain Assessment Pain Scale: 0-10 Pain Score: 6  Pain Location: Abdomen Pain Orientation: Medial Pain Descriptors / Indicators: Aching Pain Onset: On-going Pain Intervention(s): Medication (See eMAR)   See Function Navigator for Current Functional Status.   Therapy/Group: Individual Therapy  [name] 01/19/2018, 9:05 AM

## 2018-01-19 NOTE — Progress Notes (Signed)
Occupational Therapy Session Note  Patient Details  Name: [name] MRN: [id number] Date of Birth: 02/03/1971  Today's Date: 01/19/2018 OT Individual Time: 8182-9937 Session 2: 1430-1530 OT Individual Time Calculation (min): 30 min Session 2: 60 min   Short Term Goals: Week 1:  OT Short Term Goal 1 (Week 1): Pt will complete stand pivot transfer to toilet with LRAD and min A OT Short Term Goal 2 (Week 1): Pt will don pants with steadying assist using AD PRN OT Short Term Goal 3 (Week 1): Pt will don shirt with set-up assist OT Short Term Goal 4 (Week 1): Pt will recall technique for stand pivot transfer with min cuing  Skilled Therapeutic Interventions/Progress Updates:    1). Pt sitting up in w/c with c/o pain as described below but agreeable to therapy. Session focused on dynamic standing balance, functional reaching in standing, and functional mobility. Pt propelled his w/c to therapy gym with mod I. Pt completed cross-midline reaching out of COG activity to challenge dynamic standing balance. CGA- (S) provided throughout with vc for self correction. Pt then completed 150 ft of functional mobility with RW. 3 standing level rest breaks were required d/t fatigue. Pt returned to room with spouse present. Spouse applied more tape to pt's abdominal bandage. Pt left supine in bed with all needs met.   2). Pt received supine in bed with c/o pain 6/10 in abdomen. RN notified and administered medication. Pt agreed to OT and used w/c to propel to therapy gym. Pt and his wife played a game of Wii bowling to address dynamic standing balance while swinging arm outside of COG. Pt experienced 2 slight LOB which were corrected with min A. Pt fluctuated between no UE support while swinging R UE to 1 UE support on RW. Pt stood for 20 min total before requiring a seated rest break. Pt then completed 10 min on NuStep at level 6-8 to increase functional cardiorespiratory endurance to increase level of  participation/tolerance during ADL tasks. Activity was graded via resistance to increase work demands, as well as vc for encouragement and to increase/decrease exertion. Pt completed 25 ft of functional mobility with CGA before requiring a rest break d/t fatigue/pain. Pt returned to room and was left sitting up with wife present to perform ostomy care.   Therapy Documentation Precautions:  Precautions Precautions: Fall Precaution Comments: ostomy bag, large abdominal wound Required Braces or Orthoses: Other Brace/Splint Other Brace/Splint: PRN L hinge brace for longer distance/pt comfort Restrictions Weight Bearing Restrictions: Yes RLE Weight Bearing: Weight bearing as tolerated LLE Weight Bearing: Weight bearing as tolerated Pain: Pain Assessment Pain Scale: 0-10 Pain Score: 3  Faces Pain Scale: Hurts a little bit Pain Type: Surgical pain Pain Location: Abdomen Pain Orientation: Medial Pain Descriptors / Indicators: Aching Pain Onset: On-going Pain Intervention(s): Repositioned;Ambulation/increased activity PAINAD (Pain Assessment in Advanced Dementia) Breathing: normal Negative Vocalization: none Facial Expression: smiling or inexpressive Body Language: relaxed Consolability: no need to console PAINAD Score: 0  See Function Navigator for Current Functional Status.   Therapy/Group: Individual Therapy  Curtis Sites 01/19/2018, 12:32 PM
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
## 2018-01-19 NOTE — Progress Notes (Signed)
Speech Language Pathology Daily Session Note  Patient Details  Name: Taevin Mcferran MRN: [id number] Date of Birth: 1970/09/11  Today's Date: 01/19/2018 SLP Individual Time: 1100-1130 SLP Individual Time Calculation (min): 30 min  Short Term Goals: Week 1: SLP Short Term Goal 1 (Week 1): Pt will use external aids to facilitate recall of daily information with min assist verbal cues.   SLP Short Term Goal 2 (Week 1): Pt will complete mildly complex ADLs with min cues for sequencing, self monitoring, and correcting.   SLP Short Term Goal 3 (Week 1): Pt will return demonstration of at least 2 safety precautions with min verbal cues.   Skilled Therapeutic Interventions: Skilled treatment session focused on cognition goals. SLP facilitated by providing supervision cues to recall activities from earlier in day and write in memory book. Pt also performed colostomy care with supervision only. Pt was left upright in wheelchair with nursing present.      Function:    Cognition Comprehension Comprehension assist level: Follows complex conversation/direction with extra time/assistive device  Expression   Expression assist level: Expresses complex ideas: With extra time/assistive device  Social Interaction Social Interaction assist level: Interacts appropriately with others with medication or extra time (anti-anxiety, antidepressant).  Problem Solving Problem solving assist level: Solves basic problems with no assist;Solves complex 90% of the time/cues < 10% of the time  Memory Memory assist level: Recognizes or recalls 75 - 89% of the time/requires cueing 10 - 24% of the time    Pain Pain Assessment Pain Scale: 0-10 Pain Score: 3  Faces Pain Scale: Hurts a little bit Pain Type: Surgical pain Pain Location: Abdomen Pain Orientation: Medial Pain Descriptors / Indicators: Aching Pain Onset: On-going Pain Intervention(s): Repositioned;Ambulation/increased activity PAINAD (Pain Assessment in  Advanced Dementia) Breathing: normal Negative Vocalization: none Facial Expression: smiling or inexpressive Body Language: relaxed Consolability: no need to console PAINAD Score: 0  Therapy/Group: Individual Therapy  Lorenna Lurry 01/19/2018, 12:37 PM

## 2018-01-20 ENCOUNTER — Other Ambulatory Visit: Payer: Self-pay

## 2018-01-20 ENCOUNTER — Inpatient Hospital Stay (HOSPITAL_COMMUNITY): Payer: Self-pay

## 2018-01-20 ENCOUNTER — Inpatient Hospital Stay (HOSPITAL_COMMUNITY): Payer: Self-pay | Admitting: Occupational Therapy

## 2018-01-20 DIAGNOSIS — M7989 Other specified soft tissue disorders: Secondary | ICD-10-CM

## 2018-01-20 LAB — CBC
HCT: 28.8 % — ABNORMAL LOW (ref 39.0–52.0)
Hemoglobin: 8.8 g/dL — ABNORMAL LOW (ref 13.0–17.0)
MCH: 25.5 pg — ABNORMAL LOW (ref 26.0–34.0)
MCHC: 30.6 g/dL (ref 30.0–36.0)
MCV: 83.5 fL (ref 78.0–100.0)
Platelets: 709 10*3/uL — ABNORMAL HIGH (ref 150–400)
RBC: 3.45 MIL/uL — ABNORMAL LOW (ref 4.22–5.81)
RDW: 15.8 % — ABNORMAL HIGH (ref 11.5–15.5)
WBC: 13.3 10*3/uL — AB (ref 4.0–10.5)

## 2018-01-20 LAB — URINALYSIS, COMPLETE (UACMP) WITH MICROSCOPIC
Bacteria, UA: NONE SEEN
Bilirubin Urine: NEGATIVE
GLUCOSE, UA: NEGATIVE mg/dL
HGB URINE DIPSTICK: NEGATIVE
Ketones, ur: 5 mg/dL — AB
Leukocytes, UA: NEGATIVE
NITRITE: NEGATIVE
PH: 7 (ref 5.0–8.0)
Protein, ur: NEGATIVE mg/dL
Specific Gravity, Urine: 1.019 (ref 1.005–1.030)

## 2018-01-20 MED ORDER — LINACLOTIDE 145 MCG PO CAPS
290.0000 ug | ORAL_CAPSULE | Freq: Every day | ORAL | Status: DC
  Administered 2018-01-21 – 2018-01-23 (×3): 290 ug via ORAL
  Filled 2018-01-20 (×3): qty 2

## 2018-01-20 MED ORDER — POLYETHYLENE GLYCOL 3350 17 G PO PACK
17.0000 g | PACK | Freq: Every day | ORAL | Status: DC
  Administered 2018-01-20 – 2018-01-21 (×2): 17 g via ORAL
  Filled 2018-01-20 (×2): qty 1

## 2018-01-20 MED ORDER — ZOLPIDEM TARTRATE 5 MG PO TABS
5.0000 mg | ORAL_TABLET | Freq: Every evening | ORAL | Status: DC | PRN
  Administered 2018-01-20 – 2018-01-22 (×3): 5 mg via ORAL
  Filled 2018-01-20 (×3): qty 1

## 2018-01-20 NOTE — Significant Event (Signed)
Called to room for complaints of patient "not feeling right."  He states he was trying to eat some more green beans and rice but suddenly felt nauseous.  He complains of severe abdominal pain, noted to be clenching fists in pain.  Describes the pain as a gnawing, burning, sharp, stabbing pain generalized throughout entire abdomen but radiating up left chest.  Patient feels as though it has something to do with his bowel issues.  Dr. Alain Marion called.  Ordered KUB, CBC, UA/UC, and linzess 290 mcg po daily if KUB shows no obstruction.  Recommended to call him with results.  VSS with slightly elevated temp at 99.3 orally.  Will continue to monitor.  Brita Romp, RN

## 2018-01-20 NOTE — Plan of Care (Signed)
  Problem: Consults Goal: RH GENERAL PATIENT EDUCATION Description See Patient Education module for education specifics. Outcome: Progressing Goal: Skin Care Protocol Initiated - if Braden Score 18 or less Description If consults are not indicated, leave blank or document N/A Outcome: Progressing Goal: Nutrition Consult-if indicated Outcome: Progressing   Problem: RH SKIN INTEGRITY Goal: RH STG SKIN FREE OF INFECTION/BREAKDOWN Description Current wound healing and no new skin breakdown/infection with min assistance  Outcome: Progressing Goal: RH STG MAINTAIN SKIN INTEGRITY WITH ASSISTANCE Description STG Maintain Skin Integrity With mod Assistance.  Outcome: Progressing Goal: RH STG ABLE TO PERFORM INCISION/WOUND CARE W/ASSISTANCE Description STG Able To Perform Incision/Wound Care With mod Assistance.  Outcome: Progressing   Problem: RH SAFETY Goal: RH STG ADHERE TO SAFETY PRECAUTIONS W/ASSISTANCE/DEVICE Description STG Adhere to Safety Precautions With min Assistance/Device.  Outcome: Progressing   Problem: RH PAIN MANAGEMENT Goal: RH STG PAIN MANAGED AT OR BELOW PT'S PAIN GOAL Description Pain < or = 5 with mod assistance  Outcome: Progressing   Problem: RH KNOWLEDGE DEFICIT GENERAL Goal: RH STG INCREASE KNOWLEDGE OF SELF CARE AFTER HOSPITALIZATION Description Patient able to verbalize/ demonstrate care of colostomy and dressing change of abdominal wound with mod assistance  Outcome: Progressing   Problem: RH BOWEL ELIMINATION Goal: RH STG MANAGE BOWEL WITH ASSISTANCE Description STG Manage Bowel with mod Assistance.  Outcome: Not Progressing   Patient required additional measures for bowel evacuation due to constipation and colostomy.  Bowel regimen adjusted.

## 2018-01-20 NOTE — Progress Notes (Signed)
Follow up exam for the positive right gastrocnemius vein. Remains positive for proximal gastrocnemius vein DVT.  Dvt appears unchanged from previous exam.No evidence of a Baker's cyst. Toma Copier, RVS 01/20/2018 1:42 PM

## 2018-01-20 NOTE — Progress Notes (Signed)
Occupational Therapy Session Note  Patient Details  Name: [name] MRN: [id number] Date of Birth: 1970-11-21  Today's Date: 01/20/2018 OT Individual Time: 1110-1200 OT Individual Time Calculation (min): 50 min   Short Term Goals: Week 1:  OT Short Term Goal 1 (Week 1): Pt will complete stand pivot transfer to toilet with LRAD and min A OT Short Term Goal 2 (Week 1): Pt will don pants with steadying assist using AD PRN OT Short Term Goal 3 (Week 1): Pt will don shirt with set-up assist OT Short Term Goal 4 (Week 1): Pt will recall technique for stand pivot transfer with min cuing  Skilled Therapeutic Interventions/Progress Updates:    Pt greeted supine in bed with spouse present. No c/o pain and amenable to tx. He donned hinged knee brace and shoes EOB with increased time and use of bedside chair for propping LEs as needed. Pt then ambulated with RW down to dayroom with steady assist. He then engaged in bowling Wii activity to focus on dynamic standing balance and activity tolerance. Pt playing against spouse to increase volition. Afterwards he greatly wanted to try ambulating without device. Mod A for 30 ft ambulation at hall rail with vcs for upright posture and forward gaze. He used RW for remainder of way due to fatigue. At end of session he was left in w/c with spouse present.   Therapy Documentation Precautions:  Precautions Precautions: Fall Precaution Comments: ostomy bag, large abdominal wound Required Braces or Orthoses: Other Brace/Splint Other Brace/Splint: PRN L hinge brace for longer distance/pt comfort Restrictions Weight Bearing Restrictions: Yes RLE Weight Bearing: Weight bearing as tolerated LLE Weight Bearing: Weight bearing as tolerated Pain: Pain Assessment Pain Scale: 0-10 Pain Score: 5  Pain Type: Acute pain Pain Location: Abdomen Pain Orientation: Right;Left Pain Descriptors / Indicators: Sharp;Shooting;Discomfort Pain Onset: Gradual Patients Stated Pain  Goal: 0 Pain Intervention(s): Medication (See eMAR) ADL:      See Function Navigator for Current Functional Status.   Therapy/Group: Individual Therapy  [name] 01/20/2018, 12:44 PM

## 2018-01-20 NOTE — Progress Notes (Signed)
Patient was given sorbitol yesterday for small hard balls of stool through colostomy.  Patient experiencing abdominal pain associated with hard stools.  Small amount of mushy stool noted in ostomy bag this morning.  Patient has been taking sorbitol approximately every 3 days for constipation.  Consulted Dr. Inda Merlin for other means of bowel management.  Ordered miralax daily.  Educated patient on choosing foods that promote regular BMs and medications that can increase constipation.  Will continue to monitor ostomy for appropriate output today.  Brita Romp, RN
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
## 2018-01-20 NOTE — Progress Notes (Signed)
Occupational Therapy Session Note  Patient Details  Name: [name] MRN: [id number] Date of Birth: 18-Mar-1971  Today's Date: 01/20/2018       Short Term Goals: Week 1:  OT Short Term Goal 1 (Week 1): Pt will complete stand pivot transfer to toilet with LRAD and min A OT Short Term Goal 2 (Week 1): Pt will don pants with steadying assist using AD PRN OT Short Term Goal 3 (Week 1): Pt will don shirt with set-up assist OT Short Term Goal 4 (Week 1): Pt will recall technique for stand pivot transfer with min cuing Week 2:    Week 3:     Skilled Therapeutic Interventions/Progress Updates:    Attempted visit and pt was in vascular.  Attempted later in afternoon.  Pt refused due to fatigue.    Therapy Documentation Precautions:  Precautions Precautions: Fall Precaution Comments: ostomy bag, large abdominal wound Required Braces or Orthoses: Other Brace/Splint Other Brace/Splint: PRN L hinge brace for longer distance/pt comfort Restrictions Weight Bearing Restrictions: Yes RLE Weight Bearing: Weight bearing as tolerated LLE Weight Bearing: Weight bearing as tolerated                  See Function Navigator for Current Functional Status.   Therapy/Group: Individual Therapy  Lisa Roca 01/20/2018, 11:35 AM

## 2018-01-21 ENCOUNTER — Inpatient Hospital Stay (HOSPITAL_COMMUNITY): Payer: Self-pay

## 2018-01-21 ENCOUNTER — Inpatient Hospital Stay (HOSPITAL_COMMUNITY): Payer: Self-pay | Admitting: Occupational Therapy

## 2018-01-21 ENCOUNTER — Inpatient Hospital Stay (HOSPITAL_COMMUNITY): Payer: Self-pay | Admitting: Physical Therapy

## 2018-01-21 ENCOUNTER — Encounter (HOSPITAL_COMMUNITY): Payer: Self-pay | Admitting: Psychology

## 2018-01-21 DIAGNOSIS — K5901 Slow transit constipation: Secondary | ICD-10-CM

## 2018-01-21 DIAGNOSIS — R109 Unspecified abdominal pain: Secondary | ICD-10-CM

## 2018-01-21 DIAGNOSIS — F4001 Agoraphobia with panic disorder: Secondary | ICD-10-CM

## 2018-01-21 DIAGNOSIS — R1084 Generalized abdominal pain: Secondary | ICD-10-CM

## 2018-01-21 LAB — CREATININE, SERUM
CREATININE: 0.78 mg/dL (ref 0.61–1.24)
GFR calc Af Amer: >60 mL/min (ref >60)
GFR calc non Af Amer: >60 mL/min (ref >60)

## 2018-01-21 MED ORDER — POLYETHYLENE GLYCOL 3350 17 G PO PACK
17.0000 g | PACK | Freq: Two times a day (BID) | ORAL | Status: DC
  Administered 2018-01-21 – 2018-01-22 (×3): 17 g via ORAL
  Filled 2018-01-21 (×3): qty 1

## 2018-01-21 MED ORDER — RIVAROXABAN 15 MG PO TABS
15.0000 mg | ORAL_TABLET | Freq: Two times a day (BID) | ORAL | Status: DC
  Administered 2018-01-21 – 2018-01-23 (×5): 15 mg via ORAL
  Filled 2018-01-21 (×5): qty 1

## 2018-01-21 NOTE — Progress Notes (Signed)
Physical Therapy Session Note  Patient Details  Name: [name] MRN: [id number] Date of Birth: 10-01-1970  Today's Date: 01/21/2018 PT Individual Time: 0915-1030 PT Individual Time Calculation (min): 75 min   Short Term Goals: Week 1:  PT Short Term Goal 1 (Week 1): Pt will initiate stair training for strengthening.  PT Short Term Goal 2 (Week 1): Pt will ambulate 35 ft with LRAD & min assist. PT Short Term Goal 3 (Week 1): Pt will complete sit>stand transfers with mod assist.  Skilled Therapeutic Interventions/Progress Updates: Pt received asleep in bed, requires several minutes to arouse and participate. C/o abdominal pain overnight and not sleeping well, but currently no pain. Pt ambulates within room with RW and S to retrieve pants from bathroom, empty ostomy bag with assistance from wife, dress with S and increased time. W/c propulsion x150' BUE with modI. Ascent/descent 16 steps in gym stairs with close B handrails and S; reciprocal ascent, step-to descent. Performed ascent/descent 2x24 steps in stairwell with S, min cues for technique during descent. Standing balance on airex foam pad while engaged in BUE pipe tree; one posterior LOB requiring minA to recover. Standing balance on rocker board oriented medial/lateral and anterior/posterior with no UE support and pt performing weight shifts; declines attempts at therapist-applied perturbations as pt fearful of falling despite education regarding purpose of training reactive balance strategies. Standing hip abduction 2x10 reps BLE, heel raises 2x10 reps. Returned to room gait with RW and S x150', one standing rest break d/t pain/fatigue. Remained seated in w/c at end of session, wife present and all needs in reach.      Therapy Documentation Precautions:  Precautions Precautions: Fall Precaution Comments: ostomy bag, large abdominal wound Required Braces or Orthoses: Other Brace/Splint Other Brace/Splint: PRN L hinge brace for longer  distance/pt comfort Restrictions Weight Bearing Restrictions: Yes RLE Weight Bearing: Weight bearing as tolerated LLE Weight Bearing: Weight bearing as tolerated   See Function Navigator for Current Functional Status.   Therapy/Group: Individual Therapy  [name] 01/21/2018, 10:25 AM
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
## 2018-01-21 NOTE — Progress Notes (Signed)
Speech Language Pathology Daily Session Note  Patient Details  Name: [name] MRN: [id number] Date of Birth: 04-08-1971  Today's Date: 01/21/2018 SLP Individual Time: 1100-1158 SLP Individual Time Calculation (min): 58 min  Short Term Goals: Week 1: SLP Short Term Goal 1 (Week 1): Pt will use external aids to facilitate recall of daily information with min assist verbal cues.   SLP Short Term Goal 2 (Week 1): Pt will complete mildly complex ADLs with min cues for sequencing, self monitoring, and correcting.   SLP Short Term Goal 3 (Week 1): Pt will return demonstration of at least 2 safety precautions with min verbal cues.   Skilled Therapeutic Interventions:Skilled ST services focused on cognitive skills. SLP facilitated mildly complex problem solving tasks utilizing money management ( ALFA) required supervision A verbal cues and extra time, 4-5 card sequencing task required min A verbal cues for error awareness/correction, and ALFA daily math problems targeting working memory/problem solving required min-mod A verbal cues. SLP returned pt to room and provided supervision with pt's wife to empty/clean colostomy bag . Pt was left in restroom with wife. Recommend to continue skilled ST services.      Function:  Eating Eating                 Cognition Comprehension Comprehension assist level: Follows complex conversation/direction with extra time/assistive device  Expression   Expression assist level: Expresses complex ideas: With extra time/assistive device  Social Interaction Social Interaction assist level: Interacts appropriately 90% of the time - Needs monitoring or encouragement for participation or interaction.  Problem Solving Problem solving assist level: Solves basic 75 - 89% of the time/requires cueing 10 - 24% of the time;Solves basic problems with no assist;Solves basic 90% of the time/requires cueing < 10% of the time  Memory Memory assist level: Recognizes or recalls  90% of the time/requires cueing < 10% of the time    Pain Pain Assessment Faces Pain Scale: Hurts a little bit Pain Type: Acute pain Pain Location: Abdomen  Therapy/Group: Individual Therapy  Tityana Pagan  [facility] 01/21/2018, 12:07 PM
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
## 2018-01-22 ENCOUNTER — Inpatient Hospital Stay (HOSPITAL_COMMUNITY): Payer: Self-pay | Admitting: Speech Pathology

## 2018-01-22 ENCOUNTER — Inpatient Hospital Stay (HOSPITAL_COMMUNITY): Payer: Self-pay | Admitting: Physical Therapy

## 2018-01-22 ENCOUNTER — Inpatient Hospital Stay (HOSPITAL_COMMUNITY): Payer: Self-pay | Admitting: Occupational Therapy

## 2018-01-22 LAB — CBC WITH DIFFERENTIAL/PLATELET
Abs Immature Granulocytes: 0.1 10*3/uL (ref 0.0–0.1)
BASOS ABS: 0.1 10*3/uL (ref 0.0–0.1)
BASOS PCT: 0 %
EOS ABS: 0.7 10*3/uL (ref 0.0–0.7)
Eosinophils Relative: 6 %
HCT: 29.7 % — ABNORMAL LOW (ref 39.0–52.0)
Hemoglobin: 9 g/dL — ABNORMAL LOW (ref 13.0–17.0)
IMMATURE GRANULOCYTES: 1 %
LYMPHS ABS: 3.3 10*3/uL (ref 0.7–4.0)
Lymphocytes Relative: 28 %
MCH: 25.4 pg — ABNORMAL LOW (ref 26.0–34.0)
MCHC: 30.3 g/dL (ref 30.0–36.0)
MCV: 83.7 fL (ref 78.0–100.0)
Monocytes Absolute: 1.2 10*3/uL — ABNORMAL HIGH (ref 0.1–1.0)
Monocytes Relative: 10 %
NEUTROS PCT: 55 %
Neutro Abs: 6.4 10*3/uL (ref 1.7–7.7)
PLATELETS: 707 10*3/uL — AB (ref 150–400)
RBC: 3.55 MIL/uL — AB (ref 4.22–5.81)
RDW: 15.9 % — AB (ref 11.5–15.5)
WBC: 11.6 10*3/uL — AB (ref 4.0–10.5)

## 2018-01-22 LAB — BASIC METABOLIC PANEL
Anion gap: 10 (ref 5–15)
BUN: 9 mg/dL (ref 6–20)
CALCIUM: 9.5 mg/dL (ref 8.9–10.3)
CHLORIDE: 96 mmol/L — AB (ref 98–111)
CO2: 28 mmol/L (ref 22–32)
Creatinine, Ser: 0.85 mg/dL (ref 0.61–1.24)
Glucose, Bld: 95 mg/dL (ref 70–99)
Potassium: 4.3 mmol/L (ref 3.5–5.1)
SODIUM: 134 mmol/L — AB (ref 135–145)

## 2018-01-22 LAB — URINE CULTURE: Culture: NO GROWTH

## 2018-01-22 NOTE — Progress Notes (Signed)
Speech Language Pathology Discharge Summary  Patient Details  Name: [name] MRN: [id number] Date of Birth: 06/29/1971  Today's Date: 01/22/2018 SLP Individual Time: 0800-0855 SLP Individual Time Calculation (min): 55 min   Skilled Therapeutic Interventions:  Pt was seen for skilled ST targeting cognitive goals.  SLP administered the [employer] standardized cognitive assessment to measure progress from initial evaluation.  Pt scored 20/30 on evaluation with deficits most notable for delayed recall and executive functioning.  Pt was very anxious today and verbalized frustration regarding going home with colostomy.  Pt had decreased frustration tolerance and increased emotional lability today in comparison to previous therapy sessions.  Suspect increased tension and anxiety was primarily related to discharge and it definitely impacted his performance on assessment.  Discussed memory compensatory strategies with pt and his wife, Tina.  All questions were answered to their satisfaction at this time.  Pt was left in bed with wife at bedside.  Pt is ready for discharge tomorrow.       Patient has met 3 of 3 long term goals.  Patient to discharge at overall Supervision level.  Reasons goals not met:     Clinical Impression/Discharge Summary: Pt has made functional gains while inpatient and has met 3 out of 3 long term goals.  Pt is discharging at an overall supervision level due to mild cognitive deficits and significant anxiety in the setting of acute PTSD (per neuropsych).  Pt has demonstrated improved functional recall for daily information but still struggles with recalling more complex information.  Pt and family education is complete at this time.  Recommend 24/7 supervision at discharge in addition to ST follow up at next level of care.    Care Partner:  Caregiver Able to Provide Assistance: Yes  Type of Caregiver Assistance: Physical;Cognitive  Recommendation:  24 hour  supervision/assistance;Home Health SLP;Outpatient SLP  Rationale for SLP Follow Up: Maximize cognitive function and independence;Reduce caregiver burden   Equipment: none recommended by SLP    Reasons for discharge: Discharged from hospital   Patient/Family Agrees with Progress Made and Goals Achieved: Yes   Function:  Eating Eating                 Cognition Comprehension Comprehension assist level: Follows complex conversation/direction with extra time/assistive device  Expression   Expression assist level: Expresses complex ideas: With extra time/assistive device  Social Interaction Social Interaction assist level: Interacts appropriately 90% of the time - Needs monitoring or encouragement for participation or interaction.  Problem Solving Problem solving assist level: Solves basic 90% of the time/requires cueing < 10% of the time  Memory Memory assist level: Recognizes or recalls 90% of the time/requires cueing < 10% of the time   ,  L 01/22/2018, 9:01 AM    
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
## 2018-01-22 NOTE — Progress Notes (Signed)
Occupational Therapy Discharge Summary  Patient Details  Name: [name] MRN: [id number] Date of Birth: 12-20-1970   Patient has met 8 of 8 long term goals due to improved activity tolerance, improved balance, postural control, ability to compensate for deficits, improved attention and improved coordination.  Patient to discharge at overall Supervision level.  Patient's care partner is independent to provide the necessary physical and cognitive assistance at discharge.  Pt's wife has been present throughout rehab admission and is aware of pt's current level of physical function as well as some cognitive decline, likely secondary due to increased anxiety and acute PTSD per Neuropsychologist. Pt ambulated functional distances with RW and supervision. Completes ADLs at supervision level, sponge bathing at sink until medically cleared to shower. Pt's wife has been assisting with ostomy care. Pt and wife voice feeling comfortable and confident with planned d/c home.  Recommendation:  Patient will benefit from ongoing skilled OT services in home health setting to continue to advance functional skills in the area of BADL, iADL and Reduce care partner burden.  Equipment: RW, tub transfer bench  Reasons for discharge: treatment goals met and discharge from hospital  Patient/family agrees with progress made and goals achieved: Yes  OT Discharge Precautions/Restrictions  Precautions Precautions: Fall Precaution Comments: ostomy bag, large abdominal wound Required Braces or Orthoses: Other Brace/Splint Other Brace/Splint: PRN L hinge brace for longer distance/pt comfort Restrictions Weight Bearing Restrictions: Yes RLE Weight Bearing: Weight bearing as tolerated LLE Weight Bearing: Weight bearing as tolerated Vision Baseline Vision/History: Wears glasses Wears Glasses: Reading only Patient Visual Report: No change from baseline Vision Assessment?: No apparent visual deficits Perception   Perception: Within Functional Limits Praxis Praxis: Intact Cognition Overall Cognitive Status: Impaired/Different from baseline Arousal/Alertness: Awake/alert Orientation Level: Oriented X4 Attention: Selective Selective Attention: Impaired Selective Attention Impairment: Verbal basic;Functional complex Memory: Impaired Memory Impairment: Storage deficit;Retrieval deficit Awareness: Impaired Awareness Impairment: Anticipatory impairment Problem Solving: Appears intact Sequencing: Appears intact Organizing: Impaired Organizing Impairment: Verbal basic;Functional basic Self Monitoring: Impaired Self Monitoring Impairment: Functional basic Self Correcting: Impaired Self Correcting Impairment: Functional basic Safety/Judgment: Appears intact Sensation Sensation Light Touch: Appears Intact Coordination Gross Motor Movements are Fluid and Coordinated: No Fine Motor Movements are Fluid and Coordinated: Yes Coordination and Movement Description: Impaired 2/2 pain Motor  Motor Motor: Other (comment) Motor - Discharge Observations: generallized weakness, pain limitations Trunk/Postural Assessment  Cervical Assessment Cervical Assessment: Within Functional Limits Thoracic Assessment Thoracic Assessment: Within Functional Limits Lumbar Assessment Lumbar Assessment: Within Functional Limits Postural Control Postural Control: Deficits on evaluation(Impaired 2/2 pain and WBAT restrictions)  Balance Balance Balance Assessed: Yes Dynamic Sitting Balance Dynamic Sitting - Balance Support: During functional activity;Feet supported Dynamic Sitting - Level of Assistance: 6: Modified independent (Device/Increase time) Sitting balance - Comments: Sitting EOB  Static Standing Balance Static Standing - Balance Support: During functional activity;No upper extremity supported Static Standing - Level of Assistance: 5: Stand by assistance Dynamic Standing Balance Dynamic Standing - Balance  Support: During functional activity;Bilateral upper extremity supported Dynamic Standing - Level of Assistance: 5: Stand by assistance Dynamic Standing - Comments: Heavy reliance on UEs during standing though demonstrates ability to maintain balance without UE support during functional tasks Extremity/Trunk Assessment RUE Assessment RUE Assessment: Within Functional Limits LUE Assessment LUE Assessment: Exceptions to [facility] Active Range of Motion (AROM) Comments: Shoulder flexion ~90 degrees 2/2 hx of rotator cuff injury General Strength Comments: pt reports he has L rotator cuff tear, however, able to use at functional level LUE Body System:  Ortho LUE AROM (degrees) Overall AROM Left Upper Extremity: Due to premorbid status   See Function Navigator for Current Functional Status.  Kurstyn Larios L 01/22/2018, 7:29 AM

## 2018-01-22 NOTE — Discharge Summary (Signed)
Discharge summary job [phone]

## 2018-01-22 NOTE — Progress Notes (Signed)
Occupational Therapy Session Note  Patient Details  Name: [name] MRN: [id number] Date of Birth: 05-Nov-1970  Today's Date: 01/22/2018 OT Individual Time: 1305-1400 OT Individual Time Calculation (min): 55 min    Short Term Goals: Week 1:  OT Short Term Goal 1 (Week 1): Pt will complete stand pivot transfer to toilet with LRAD and min A OT Short Term Goal 2 (Week 1): Pt will don pants with steadying assist using AD PRN OT Short Term Goal 3 (Week 1): Pt will don shirt with set-up assist OT Short Term Goal 4 (Week 1): Pt will recall technique for stand pivot transfer with min cuing  Skilled Therapeutic Interventions/Progress Updates:    Pt seen for OT session focusing on functional transfers and mobility. Pt in supine upon arrival, required encouragement for participation in session. Pt transferred to EOB and donned shoes with increased time.  HE ambulated to therapy gym with RW and supervision, cont to require VCs for reducing UE reliance on RW.  PT reports that pt to d/c with rollator, therefore pt provided with one to trial during session. Completed ambulation within unit with rollator, cuing for upright posture and increasing ambulation rate/speed with counting rhythm. Education and demonstration provided for management of rollator breaks during functional mobility. Upon return to gym, provided education regarding fall recovery including calling 911 if injury is suspected. Following demonstration and cuing and with lots of encouragement and deep breathing techniques. Pt able to complete transfer with guarding assist.  Pt had small BM in ostomy bag and desired to go to bathroom to empty. With encouragement, willing to try to access public restroom in prep for d/c. Pt and wife utilized family bathroom and completed toileting task. Pt reports blood in stool when emptying, RN made aware. Pt returned to room at end of session, left seated on EOB with wife present.    Therapy  Documentation Precautions:  Precautions Precautions: Fall Precaution Comments: ostomy bag, large abdominal wound Required Braces or Orthoses: Other Brace/Splint Other Brace/Splint: PRN L hinge brace for longer distance/pt comfort Restrictions Weight Bearing Restrictions: Yes RLE Weight Bearing: Weight bearing as tolerated LLE Weight Bearing: Weight bearing as tolerated Pain: Pain Assessment Pain Scale: 0-10 Pain Score: 0-No pain Faces Pain Scale: No hurt  See Function Navigator for Current Functional Status.   Therapy/Group: Individual Therapy  [name] L 01/22/2018, 11:50 AM
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
## 2018-01-22 NOTE — Consult Note (Signed)
Junior Nurse ostomy follow up Patient planned for DC tomorrow, follow up today to make sure he has supplies and information for care for his ostomy for DC to home Stoma type/location: LLQ, end colostomy Stomal assessment/size: 1 1/2" wide x 1" oval shaped Treatment options for stomal/peristomal skin: using 2" barrier ring for slightly flush stoma Output  Ostomy pouching: 2pc. 2 3/4" with 2" barrier ring Education provided:  Enrolled patient in [employer] Discharge program: Yes. Additionally patient is indigent at this time, connected patient's family with Secure Start DC program as well as the assistance program. They will need to contact this program today to get approved.   Will send 4 pouching systems home with patient and family. Provided [employer] with items used marked for patient and family. Mother is independent with pouch change.  Provided local suppliers per mother's request to purchase pouches.  Patient is reported to not have [place] planned.   Discussed POC with patient and bedside nurse.  Re consult if needed, will not follow at this time. Thanks  Thaddeus Evitts [employer], RN,CWOCN, CNS, [place] [phone])

## 2018-01-22 NOTE — Progress Notes (Signed)
Alerted that patient is being discharged today.  We will arrange for follow up in our office to evaluate his abdomen in 2 weeks.  We will call him with appointment date and time.  Henreitta Cea 8:54 AM 01/22/2018

## 2018-01-23 LAB — CBC WITH DIFFERENTIAL/PLATELET
ABS IMMATURE GRANULOCYTES: 0 10*3/uL (ref 0.0–0.1)
BASOS ABS: 0.1 10*3/uL (ref 0.0–0.1)
Basophils Relative: 1 %
Eosinophils Absolute: 0.6 10*3/uL (ref 0.0–0.7)
Eosinophils Relative: 6 %
HCT: 32.3 % — ABNORMAL LOW (ref 39.0–52.0)
Hemoglobin: 9.8 g/dL — ABNORMAL LOW (ref 13.0–17.0)
IMMATURE GRANULOCYTES: 0 %
Lymphocytes Relative: 27 %
Lymphs Abs: 2.6 10*3/uL (ref 0.7–4.0)
MCH: 25.4 pg — ABNORMAL LOW (ref 26.0–34.0)
MCHC: 30.3 g/dL (ref 30.0–36.0)
MCV: 83.7 fL (ref 78.0–100.0)
MONOS PCT: 9 %
Monocytes Absolute: 0.9 10*3/uL (ref 0.1–1.0)
NEUTROS ABS: 5.5 10*3/uL (ref 1.7–7.7)
NEUTROS PCT: 57 %
PLATELETS: 727 10*3/uL — AB (ref 150–400)
RBC: 3.86 MIL/uL — AB (ref 4.22–5.81)
RDW: 15.7 % — ABNORMAL HIGH (ref 11.5–15.5)
WBC: 9.7 10*3/uL (ref 4.0–10.5)

## 2018-01-23 MED ORDER — LINACLOTIDE 290 MCG PO CAPS: 290.0000 ug | ORAL_CAPSULE | Freq: Every day | ORAL | 1 refills | Status: DC

## 2018-01-23 MED ORDER — OXYCODONE HCL 5 MG PO TABS: 5.0000 mg | ORAL_TABLET | ORAL | 0 refills | Status: DC | PRN

## 2018-01-23 MED ORDER — FAMOTIDINE 20 MG PO TABS: 20.0000 mg | ORAL_TABLET | Freq: Every day | ORAL | 0 refills | Status: DC

## 2018-01-23 MED ORDER — VITAMIN E 180 MG (400 UNIT) PO CAPS: 400.0000 [IU] | ORAL_CAPSULE | Freq: Every day | ORAL | 1 refills | Status: DC

## 2018-01-23 MED ORDER — RIVAROXABAN 15 MG PO TABS: 15.0000 mg | ORAL_TABLET | Freq: Two times a day (BID) | ORAL | 0 refills | Status: DC

## 2018-01-23 MED ORDER — ACETAMINOPHEN 325 MG PO TABS: 650.0000 mg | ORAL_TABLET | Freq: Four times a day (QID) | ORAL | Status: AC

## 2018-01-23 MED ORDER — CLONAZEPAM 0.5 MG PO TBDP: 0.5000 mg | ORAL_TABLET | Freq: Two times a day (BID) | ORAL | 0 refills | Status: DC

## 2018-01-23 MED ORDER — FLUOXETINE HCL 10 MG PO CAPS: 10.0000 mg | ORAL_CAPSULE | Freq: Every day | ORAL | 3 refills | Status: DC

## 2018-01-23 MED ORDER — RIVAROXABAN 20 MG PO TABS: 20.0000 mg | ORAL_TABLET | Freq: Every day | ORAL | 1 refills | Status: DC

## 2018-01-23 MED ORDER — RIVAROXABAN (XARELTO) VTE STARTER PACK (15 & 20 MG): ORAL_TABLET | ORAL | 0 refills | Status: DC

## 2018-01-23 MED ORDER — POLYETHYLENE GLYCOL 3350 17 G PO PACK: 17.0000 g | PACK | Freq: Every day | ORAL | Status: DC

## 2018-01-23 MED ORDER — BETHANECHOL CHLORIDE 10 MG PO TABS: 10.0000 mg | ORAL_TABLET | Freq: Three times a day (TID) | ORAL | 0 refills | Status: DC

## 2018-01-23 MED ORDER — POLYETHYLENE GLYCOL 3350 17 G PO PACK: 17.0000 g | PACK | Freq: Two times a day (BID) | ORAL | 0 refills | Status: DC

## 2018-01-23 MED ORDER — DOCUSATE SODIUM 100 MG PO CAPS: 100.0000 mg | ORAL_CAPSULE | Freq: Two times a day (BID) | ORAL | 0 refills | Status: DC

## 2018-01-23 MED ORDER — TAMSULOSIN HCL 0.4 MG PO CAPS: 0.4000 mg | ORAL_CAPSULE | Freq: Every day | ORAL | 1 refills | Status: DC

## 2018-01-23 MED ORDER — DOCUSATE SODIUM 100 MG PO CAPS: 100.0000 mg | ORAL_CAPSULE | Freq: Every day | ORAL | Status: DC

## 2018-01-23 NOTE — Progress Notes (Signed)
Pt d/c home with personal property. Discharge instructions provided by Marlowe Shores PA-c.  Pt and family at bedside confirmed understanding of medications, follow up appts, equipment, dressing changes and what to do if changes noted.

## 2018-01-23 NOTE — Discharge Instructions (Signed)
Inpatient Rehab Discharge Instructions  [name] Discharge date and time: No discharge date for patient encounter.   Activities/Precautions/ Functional Status: Activity: Hinged knee brace left lower extremity.  Weightbearing as tolerated Diet: soft Wound Care: keep wound clean and dry Functional status:  ___ No restrictions     ___ Walk up steps independently ___ 24/7 supervision/assistance   ___ Walk up steps with assistance ___ Intermittent supervision/assistance  ___ Bathe/dress independently ___ Walk with walker     _x__ Bathe/dress with assistance ___ Walk Independently    ___ Shower independently ___ Walk with assistance    ___ Shower with assistance ___ No alcohol     ___ Return to work/school ________  COMMUNITY REFERRALS UPON DISCHARGE:   Home Health:   PT     OT      RN  Agency:  [facility] Phone:  [phone] Medical Equipment/Items Ordered:  Rollator and tub transfer bench  Agency/Supplier:  [facility]         Phone:  [phone]  Special Instructions: Routine colostomy care  No smoking driving or alcohol   My questions have been answered and I understand these instructions. I will adhere to these goals and the provided educational materials after my discharge from the hospital.  Patient/Caregiver Signature _______________________________ Date __________  Clinician Signature _______________________________________ Date __________  Please bring this form and your medication list with you to all your follow-up doctor's appointments. Inpatient Rehab Discharge Instructions  [name] Discharge date and time: No discharge date for patient encounter.   Activities/Precautions/ Functional Status: Activity: activity as tolerated Diet: soft Wound Care: keep wound clean and dry Functional status:  ___ No restrictions     ___ Walk up steps independently ___ 24/7 supervision/assistance   ___ Walk up steps with assistance ___ Intermittent  supervision/assistance  ___ Bathe/dress independently ___ Walk with walker     ___ Bathe/dress with assistance ___ Walk Independently    ___ Shower independently ___ Walk with assistance    ___ Shower with assistance ___ No alcohol     ___ Return to work/school ________  Special Instructions:    My questions have been answered and I understand these instructions. I will adhere to these goals and the provided educational materials after my discharge from the hospital.  Patient/Caregiver Signature _______________________________ Date __________  Clinician Signature _______________________________________ Date __________  Please bring this form and your medication list with you to all your follow-up doctor's appointments.    Information on my medicine - XARELTO (rivaroxaban)  WHY WAS XARELTO PRESCRIBED FOR YOU? Xarelto was prescribed to treat blood clots that may have been found in the veins of your legs (deep vein thrombosis) or in your lungs (pulmonary embolism) and to reduce the risk of them occurring again.  What do you need to know about Xarelto? The starting dose is one 15 mg tablet taken TWICE daily with food for the FIRST 21 DAYS then on (enter date)    the dose is changed to one 20 mg tablet taken ONCE A DAY with your evening meal.  DO NOT stop taking Xarelto without talking to the health care provider who prescribed the medication.  Refill your prescription for 20 mg tablets before you run out.  After discharge, you should have regular check-up appointments with your healthcare provider that is prescribing your Xarelto.  In the future your dose may need to be changed if your kidney function changes by a significant amount.  What do you do  if you miss a dose? If you are taking Xarelto TWICE DAILY and you miss a dose, take it as soon as you remember. You may take two 15 mg tablets (total 30 mg) at the same time then resume your regularly scheduled 15 mg twice daily the  next day.  If you are taking Xarelto ONCE DAILY and you miss a dose, take it as soon as you remember on the same day then continue your regularly scheduled once daily regimen the next day. Do not take two doses of Xarelto at the same time.   Important Safety Information Xarelto is a blood thinner medicine that can cause bleeding. You should call your healthcare provider right away if you experience any of the following: ? Bleeding from an injury or your nose that does not stop. ? Unusual colored urine (red or dark brown) or unusual colored stools (red or black). ? Unusual bruising for unknown reasons. ? A serious fall or if you hit your head (even if there is no bleeding).  Some medicines may interact with Xarelto and might increase your risk of bleeding while on Xarelto. To help avoid this, consult your healthcare provider or pharmacist prior to using any new prescription or non-prescription medications, including herbals, vitamins, non-steroidal anti-inflammatory drugs (NSAIDs) and supplements.  This website has more information on Xarelto: [email or web address]-benson.[email or web address]

## 2018-01-24 ENCOUNTER — Telehealth: Payer: Self-pay | Admitting: Registered Nurse

## 2018-01-24 NOTE — Telephone Encounter (Signed)
Transitional Care call Transitional Care Call Questions Answered by Mother. Ms. [name].  Patient name: [name] DOB: [birth year] 1. Are you/is patient experiencing any problems since coming home? No a. Are there any questions regarding any aspect of care? No 2. Are there any questions regarding medications administration/dosing? No a. Are meds being taken as prescribed? Yes, Per Mother. [name] wasn't home to review medication list.  b. "Patient should review meds with caller to confirm"  3. Have there been any falls? No 4. Has Home Health been to the house and/or have they contacted you? Yes, [facility] a. If not, have you tried to contact them? NA b. Can we help you contact them? NA 5. Are bowels and bladder emptying properly? Colostomy functioning properly and bladdder emptying.  a. Are there any unexpected incontinence issues? No b. If applicable, is patient following bowel/bladder programs? NA 6. Any fevers, problems with breathing, unexpected pain? No 7. Are there any skin problems or new areas of breakdown? No 8. Has the patient/family member arranged specialty MD follow up (ie cardiology/neurology/renal/surgical/etc.)?  [name] will make sure [name] has called surgeon for follow up appointment.  a. Can we help arrange? No 9. Does the patient need any other services or support that we can help arrange? No 10. Are caregivers following through as expected in assisting the patient? Yes 11. Has the patient quit smoking, drinking alcohol, or using drugs as recommended? [name] states [name] doesn't smoke, drink alcohol or use illicit drugs.   Appointment date/time 02/05/2018, arrival time 1:20 for 1:40 appointment with [name] ANP. At [facility]

## 2018-01-24 NOTE — Progress Notes (Signed)
Social Work Discharge Note  The overall goal for the admission was met for:   Discharge location: Yes - home with wife, mother, sister  Length of Stay: Yes  Discharge activity level: Yes - supervision  Home/community participation: Yes  Services provided included: MD, RD, PT, OT, RN, Pharmacy, Neuropsych and SW  Financial Services: Other: self pay  Follow-up services arranged: Home Health: PT/OT/RN from [facility], DME: rollator and tub transfer bench from [facility] and Patient/Family has no preference for HH/DME agencies  Comments (or additional information): Wife has been at the bedside daily and has received education from the therapists and nurses.  Patient/Family verbalized understanding of follow-up arrangements: Yes  Individual responsible for coordination of the follow-up plan: pt and his wife  Confirmed correct DME delivered: Trey Sailors 01/24/2018    Malessa Zartman, [name]

## 2018-01-25 ENCOUNTER — Telehealth: Payer: Self-pay

## 2018-01-25 NOTE — Telephone Encounter (Signed)
Ryan,PT/[facility] called requesting verbal orders for 1wk4 of PT. Verbal orders given per discharge summary.

## 2018-01-28 ENCOUNTER — Telehealth: Payer: Self-pay | Admitting: *Deleted

## 2018-01-28 NOTE — Telephone Encounter (Signed)
Almyra Free [facility] called for verbal order for ST 1wk1 2wk4 for cognition.  Called x 2.  Approval given.

## 2018-02-05 ENCOUNTER — Encounter: Payer: Self-pay | Admitting: Registered Nurse

## 2018-02-05 ENCOUNTER — Encounter: Payer: Self-pay | Attending: Registered Nurse | Admitting: Registered Nurse

## 2018-02-05 ENCOUNTER — Other Ambulatory Visit: Payer: Self-pay

## 2018-02-05 VITALS — BP 134/83 | HR 89 | Ht 67.0 in | Wt 180.4 lb

## 2018-02-05 DIAGNOSIS — M545 Low back pain: Secondary | ICD-10-CM | POA: Insufficient documentation

## 2018-02-05 DIAGNOSIS — M25562 Pain in left knee: Secondary | ICD-10-CM | POA: Insufficient documentation

## 2018-02-05 DIAGNOSIS — R339 Retention of urine, unspecified: Secondary | ICD-10-CM | POA: Insufficient documentation

## 2018-02-05 DIAGNOSIS — S31139A Puncture wound of abdominal wall without foreign body, unspecified quadrant without penetration into peritoneal cavity, initial encounter: Secondary | ICD-10-CM | POA: Insufficient documentation

## 2018-02-05 DIAGNOSIS — Z79899 Other long term (current) drug therapy: Secondary | ICD-10-CM | POA: Insufficient documentation

## 2018-02-05 DIAGNOSIS — Z79891 Long term (current) use of opiate analgesic: Secondary | ICD-10-CM | POA: Insufficient documentation

## 2018-02-05 DIAGNOSIS — M25511 Pain in right shoulder: Secondary | ICD-10-CM | POA: Insufficient documentation

## 2018-02-05 DIAGNOSIS — W3400XA Accidental discharge from unspecified firearms or gun, initial encounter: Secondary | ICD-10-CM | POA: Insufficient documentation

## 2018-02-05 DIAGNOSIS — R109 Unspecified abdominal pain: Secondary | ICD-10-CM | POA: Insufficient documentation

## 2018-02-05 DIAGNOSIS — F17210 Nicotine dependence, cigarettes, uncomplicated: Secondary | ICD-10-CM | POA: Insufficient documentation

## 2018-02-05 DIAGNOSIS — Z933 Colostomy status: Secondary | ICD-10-CM | POA: Insufficient documentation

## 2018-02-05 DIAGNOSIS — I82401 Acute embolism and thrombosis of unspecified deep veins of right lower extremity: Secondary | ICD-10-CM | POA: Insufficient documentation

## 2018-02-05 DIAGNOSIS — R5381 Other malaise: Secondary | ICD-10-CM | POA: Insufficient documentation

## 2018-02-05 DIAGNOSIS — F4310 Post-traumatic stress disorder, unspecified: Secondary | ICD-10-CM | POA: Insufficient documentation

## 2018-02-05 DIAGNOSIS — F411 Generalized anxiety disorder: Secondary | ICD-10-CM | POA: Insufficient documentation

## 2018-02-05 DIAGNOSIS — M79671 Pain in right foot: Secondary | ICD-10-CM | POA: Insufficient documentation

## 2018-02-05 DIAGNOSIS — F329 Major depressive disorder, single episode, unspecified: Secondary | ICD-10-CM | POA: Insufficient documentation

## 2018-02-05 NOTE — Patient Instructions (Signed)
Call Office on 08/05/ 2019, to discuss Klonopin Management
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
## 2018-02-06 ENCOUNTER — Telehealth: Payer: Self-pay

## 2018-02-06 NOTE — Telephone Encounter (Signed)
Stacy, RN/[facility] called stating pt only has 2 tablets left of Oxycodone and pt was told to contact our office. I called Stacy back to let her know that pt was prescribed Oxycodone from [facility] yesterday and he would need to contact them about more medication.

## 2018-02-12 ENCOUNTER — Telehealth: Payer: Self-pay

## 2018-02-12 NOTE — Telephone Encounter (Signed)
Julie,ST/[facility] called requesting for verbal orders to change plan of care due to pt making progress. Change to 1wk1 for the next 2 wks. Approval given.

## 2018-02-18 ENCOUNTER — Telehealth: Payer: Self-pay | Admitting: Registered Nurse

## 2018-02-18 MED ORDER — OXYCODONE HCL 5 MG PO TABS: 5.0000 mg | ORAL_TABLET | Freq: Every day | ORAL | 0 refills | Status: DC | PRN

## 2018-02-18 NOTE — Telephone Encounter (Signed)
Clarification Note:  This provider spoke with [name] and re-iterated he may only take his Tylenol two capsules three times a day only, he verbalizes understanding. He was encouraged to keep follow up appointments.

## 2018-02-18 NOTE — Telephone Encounter (Signed)
Received a call from [name], requesting pain medication. Spoke with [name] he was seen at the Trauma clinic on 02/05/2018, he was prescribed Oxycodone 5mg  10 tablets by [name]. This provider placed a call to the Trauma clinic, spoke with the nurse, they will not be prescribing Oxycodone at this time, he has an appointment with them in the morning and will asses him. According to the PMP Aware Website the Oxycodone was filled on 02/05/2018. Return a call to [name], he states he's experiencing pain in his abdomen, lower back and right foot, also reports last dose of Oxycodone was on 02/08/2018. He has been taking extra strength tylenol 500 mg, he reports he's taking three to four capsules at a time (1500- 2000 mg) 3- 4 times a day, he was instructed not to take more than two capsules three times a day. Placed a message to Dr. Posey Pronto  awaiting a response.  Response. This provider will call [name] back after speaking with Dr. Posey Pronto, he verbalizes understanding.

## 2018-02-18 NOTE — Telephone Encounter (Signed)
This provider spoke with Dr. Posey Pronto, we will prescribe Oxycodone 5 mg daily dose with instructions to began weaning medication. Also re-iterated he may only take tylenol 500 mg tablets two capsules three times a day only,he verbalizes understanding.

## 2018-03-05 ENCOUNTER — Ambulatory Visit: Payer: Self-pay | Admitting: Registered Nurse

## 2018-03-05 ENCOUNTER — Encounter: Payer: Self-pay | Admitting: Registered Nurse

## 2018-03-08 ENCOUNTER — Encounter: Payer: Self-pay | Attending: Registered Nurse | Admitting: Physical Medicine & Rehabilitation

## 2018-03-08 DIAGNOSIS — F17210 Nicotine dependence, cigarettes, uncomplicated: Secondary | ICD-10-CM | POA: Insufficient documentation

## 2018-03-08 DIAGNOSIS — F4310 Post-traumatic stress disorder, unspecified: Secondary | ICD-10-CM | POA: Insufficient documentation

## 2018-03-08 DIAGNOSIS — M25511 Pain in right shoulder: Secondary | ICD-10-CM | POA: Insufficient documentation

## 2018-03-08 DIAGNOSIS — S31139A Puncture wound of abdominal wall without foreign body, unspecified quadrant without penetration into peritoneal cavity, initial encounter: Secondary | ICD-10-CM | POA: Insufficient documentation

## 2018-03-08 DIAGNOSIS — I82401 Acute embolism and thrombosis of unspecified deep veins of right lower extremity: Secondary | ICD-10-CM | POA: Insufficient documentation

## 2018-03-08 DIAGNOSIS — R5381 Other malaise: Secondary | ICD-10-CM | POA: Insufficient documentation

## 2018-03-08 DIAGNOSIS — R339 Retention of urine, unspecified: Secondary | ICD-10-CM | POA: Insufficient documentation

## 2018-03-08 DIAGNOSIS — F329 Major depressive disorder, single episode, unspecified: Secondary | ICD-10-CM | POA: Insufficient documentation

## 2018-03-08 DIAGNOSIS — Z933 Colostomy status: Secondary | ICD-10-CM | POA: Insufficient documentation

## 2018-03-08 DIAGNOSIS — Z79891 Long term (current) use of opiate analgesic: Secondary | ICD-10-CM | POA: Insufficient documentation

## 2018-03-08 DIAGNOSIS — R109 Unspecified abdominal pain: Secondary | ICD-10-CM | POA: Insufficient documentation

## 2018-03-08 DIAGNOSIS — M79671 Pain in right foot: Secondary | ICD-10-CM | POA: Insufficient documentation

## 2018-03-08 DIAGNOSIS — M545 Low back pain: Secondary | ICD-10-CM | POA: Insufficient documentation

## 2018-03-08 DIAGNOSIS — F411 Generalized anxiety disorder: Secondary | ICD-10-CM | POA: Insufficient documentation

## 2018-03-08 DIAGNOSIS — Z79899 Other long term (current) drug therapy: Secondary | ICD-10-CM | POA: Insufficient documentation

## 2018-03-08 DIAGNOSIS — M25562 Pain in left knee: Secondary | ICD-10-CM | POA: Insufficient documentation

## 2018-03-28 ENCOUNTER — Encounter: Payer: Self-pay | Attending: Psychology | Admitting: Psychology

## 2018-03-28 DIAGNOSIS — M79671 Pain in right foot: Secondary | ICD-10-CM | POA: Insufficient documentation

## 2018-03-28 DIAGNOSIS — I82401 Acute embolism and thrombosis of unspecified deep veins of right lower extremity: Secondary | ICD-10-CM | POA: Insufficient documentation

## 2018-03-28 DIAGNOSIS — Z79899 Other long term (current) drug therapy: Secondary | ICD-10-CM | POA: Insufficient documentation

## 2018-03-28 DIAGNOSIS — Z79891 Long term (current) use of opiate analgesic: Secondary | ICD-10-CM | POA: Insufficient documentation

## 2018-03-28 DIAGNOSIS — S31139A Puncture wound of abdominal wall without foreign body, unspecified quadrant without penetration into peritoneal cavity, initial encounter: Secondary | ICD-10-CM | POA: Insufficient documentation

## 2018-03-28 DIAGNOSIS — M545 Low back pain: Secondary | ICD-10-CM | POA: Insufficient documentation

## 2018-03-28 DIAGNOSIS — M25511 Pain in right shoulder: Secondary | ICD-10-CM | POA: Insufficient documentation

## 2018-03-28 DIAGNOSIS — F411 Generalized anxiety disorder: Secondary | ICD-10-CM | POA: Insufficient documentation

## 2018-03-28 DIAGNOSIS — F17210 Nicotine dependence, cigarettes, uncomplicated: Secondary | ICD-10-CM | POA: Insufficient documentation

## 2018-03-28 DIAGNOSIS — Z933 Colostomy status: Secondary | ICD-10-CM | POA: Insufficient documentation

## 2018-03-28 DIAGNOSIS — M25562 Pain in left knee: Secondary | ICD-10-CM | POA: Insufficient documentation

## 2018-03-28 DIAGNOSIS — F329 Major depressive disorder, single episode, unspecified: Secondary | ICD-10-CM | POA: Insufficient documentation

## 2018-03-28 DIAGNOSIS — R109 Unspecified abdominal pain: Secondary | ICD-10-CM | POA: Insufficient documentation

## 2018-03-28 DIAGNOSIS — F4310 Post-traumatic stress disorder, unspecified: Secondary | ICD-10-CM | POA: Insufficient documentation

## 2018-03-28 DIAGNOSIS — R339 Retention of urine, unspecified: Secondary | ICD-10-CM | POA: Insufficient documentation

## 2018-03-28 DIAGNOSIS — R5381 Other malaise: Secondary | ICD-10-CM | POA: Insufficient documentation

## 2018-04-24 ENCOUNTER — Encounter: Payer: Self-pay | Attending: Physical Medicine & Rehabilitation | Admitting: Physical Medicine & Rehabilitation

## 2018-04-24 ENCOUNTER — Encounter: Payer: Self-pay | Admitting: Physical Medicine & Rehabilitation

## 2018-04-24 VITALS — BP 149/90 | HR 60 | Ht 67.0 in | Wt 204.0 lb

## 2018-04-24 DIAGNOSIS — M25562 Pain in left knee: Secondary | ICD-10-CM | POA: Insufficient documentation

## 2018-04-24 DIAGNOSIS — M79671 Pain in right foot: Secondary | ICD-10-CM | POA: Insufficient documentation

## 2018-04-24 DIAGNOSIS — M25511 Pain in right shoulder: Secondary | ICD-10-CM | POA: Insufficient documentation

## 2018-04-24 DIAGNOSIS — Z933 Colostomy status: Secondary | ICD-10-CM | POA: Insufficient documentation

## 2018-04-24 DIAGNOSIS — F329 Major depressive disorder, single episode, unspecified: Secondary | ICD-10-CM | POA: Insufficient documentation

## 2018-04-24 DIAGNOSIS — M545 Low back pain: Secondary | ICD-10-CM | POA: Insufficient documentation

## 2018-04-24 DIAGNOSIS — R5381 Other malaise: Secondary | ICD-10-CM

## 2018-04-24 DIAGNOSIS — R269 Unspecified abnormalities of gait and mobility: Secondary | ICD-10-CM

## 2018-04-24 DIAGNOSIS — Z433 Encounter for attention to colostomy: Secondary | ICD-10-CM

## 2018-04-24 DIAGNOSIS — I82401 Acute embolism and thrombosis of unspecified deep veins of right lower extremity: Secondary | ICD-10-CM | POA: Insufficient documentation

## 2018-04-24 DIAGNOSIS — W3400XA Accidental discharge from unspecified firearms or gun, initial encounter: Secondary | ICD-10-CM

## 2018-04-24 DIAGNOSIS — R109 Unspecified abdominal pain: Secondary | ICD-10-CM | POA: Insufficient documentation

## 2018-04-24 DIAGNOSIS — R339 Retention of urine, unspecified: Secondary | ICD-10-CM | POA: Insufficient documentation

## 2018-04-24 DIAGNOSIS — F411 Generalized anxiety disorder: Secondary | ICD-10-CM

## 2018-04-24 DIAGNOSIS — F4310 Post-traumatic stress disorder, unspecified: Secondary | ICD-10-CM | POA: Insufficient documentation

## 2018-04-24 DIAGNOSIS — Z79899 Other long term (current) drug therapy: Secondary | ICD-10-CM | POA: Insufficient documentation

## 2018-04-24 DIAGNOSIS — S31139A Puncture wound of abdominal wall without foreign body, unspecified quadrant without penetration into peritoneal cavity, initial encounter: Secondary | ICD-10-CM

## 2018-04-24 DIAGNOSIS — F17210 Nicotine dependence, cigarettes, uncomplicated: Secondary | ICD-10-CM | POA: Insufficient documentation

## 2018-04-24 DIAGNOSIS — Z79891 Long term (current) use of opiate analgesic: Secondary | ICD-10-CM | POA: Insufficient documentation

## 2018-04-24 MED ORDER — SENNOSIDES-DOCUSATE SODIUM 8.6-50 MG PO TABS: 2.0000 | ORAL_TABLET | Freq: Every day | ORAL | 1 refills | Status: DC

## 2018-04-24 NOTE — Patient Instructions (Addendum)
Please follow up with Dr. Georganna Skeans general surgery  Please follow up with Pschology
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
## 2018-04-25 ENCOUNTER — Encounter: Payer: Self-pay | Admitting: Physical Medicine & Rehabilitation

## 2018-06-24 ENCOUNTER — Emergency Department (HOSPITAL_COMMUNITY)
Admission: EM | Admit: 2018-06-24 | Discharge: 2018-06-24 | Disposition: A | Discharge status: Home / Self Care | Payer: Medicaid Other | Attending: Emergency Medicine | Admitting: Emergency Medicine

## 2018-06-24 ENCOUNTER — Other Ambulatory Visit: Payer: Self-pay

## 2018-06-24 ENCOUNTER — Encounter (HOSPITAL_COMMUNITY): Payer: Self-pay | Admitting: Emergency Medicine

## 2018-06-24 DIAGNOSIS — Z79899 Other long term (current) drug therapy: Secondary | ICD-10-CM | POA: Insufficient documentation

## 2018-06-24 DIAGNOSIS — L258 Unspecified contact dermatitis due to other agents: Secondary | ICD-10-CM

## 2018-06-24 DIAGNOSIS — Z933 Colostomy status: Secondary | ICD-10-CM | POA: Insufficient documentation

## 2018-06-24 DIAGNOSIS — Z9889 Other specified postprocedural states: Secondary | ICD-10-CM | POA: Insufficient documentation

## 2018-06-24 DIAGNOSIS — F17210 Nicotine dependence, cigarettes, uncomplicated: Secondary | ICD-10-CM | POA: Insufficient documentation

## 2018-06-24 DIAGNOSIS — L231 Allergic contact dermatitis due to adhesives: Secondary | ICD-10-CM

## 2018-06-24 DIAGNOSIS — Z7901 Long term (current) use of anticoagulants: Secondary | ICD-10-CM | POA: Insufficient documentation

## 2018-06-24 DIAGNOSIS — Z86718 Personal history of other venous thrombosis and embolism: Secondary | ICD-10-CM | POA: Insufficient documentation

## 2018-06-24 LAB — COMPREHENSIVE METABOLIC PANEL
ALT: 8 U/L (ref 0–44)
ANION GAP: 10 (ref 5–15)
AST: 18 U/L (ref 15–41)
Albumin: 3.8 g/dL (ref 3.5–5.0)
Alkaline Phosphatase: 54 U/L (ref 38–126)
BUN: 9 mg/dL (ref 6–20)
CHLORIDE: 104 mmol/L (ref 98–111)
CO2: 25 mmol/L (ref 22–32)
Calcium: 9 mg/dL (ref 8.9–10.3)
Creatinine, Ser: 0.96 mg/dL (ref 0.61–1.24)
GFR calc non Af Amer: >60 mL/min (ref >60)
Glucose, Bld: 89 mg/dL (ref 70–99)
POTASSIUM: 4 mmol/L (ref 3.5–5.1)
SODIUM: 139 mmol/L (ref 135–145)
Total Bilirubin: 0.6 mg/dL (ref 0.3–1.2)
Total Protein: 6.6 g/dL (ref 6.5–8.1)

## 2018-06-24 LAB — CBC WITH DIFFERENTIAL/PLATELET
ABS IMMATURE GRANULOCYTES: 0.05 10*3/uL (ref 0.00–0.07)
Basophils Absolute: 0 10*3/uL (ref 0.0–0.1)
Basophils Relative: 0 %
EOS ABS: 0.3 10*3/uL (ref 0.0–0.5)
Eosinophils Relative: 3 %
HCT: 46 % (ref 39.0–52.0)
Hemoglobin: 13.3 g/dL (ref 13.0–17.0)
IMMATURE GRANULOCYTES: 1 %
Lymphocytes Relative: 25 %
Lymphs Abs: 2.3 10*3/uL (ref 0.7–4.0)
MCH: 23.5 pg — ABNORMAL LOW (ref 26.0–34.0)
MCHC: 28.9 g/dL — ABNORMAL LOW (ref 30.0–36.0)
MCV: 81.3 fL (ref 80.0–100.0)
Monocytes Absolute: 0.9 10*3/uL (ref 0.1–1.0)
Monocytes Relative: 10 %
NEUTROS PCT: 61 %
Neutro Abs: 5.7 10*3/uL (ref 1.7–7.7)
Platelets: 310 10*3/uL (ref 150–400)
RBC: 5.66 MIL/uL (ref 4.22–5.81)
RDW: 16.5 % — AB (ref 11.5–15.5)
WBC: 9.3 10*3/uL (ref 4.0–10.5)
nRBC: 0 % (ref 0.0–0.2)

## 2018-06-24 MED ORDER — SODIUM CHLORIDE 0.9 % IV BOLUS
500.0000 mL | Freq: Once | INTRAVENOUS | Status: AC
  Administered 2018-06-24: 500 mL via INTRAVENOUS

## 2018-06-24 MED ORDER — SODIUM CHLORIDE 0.9 % IV SOLN
INTRAVENOUS | Status: DC
  Administered 2018-06-24: 12:00:00 via INTRAVENOUS

## 2018-06-24 NOTE — Discharge Planning (Signed)
[facility] consulted regarding pt needing appointment for surgery.  Pt will need to make arrangements through PCP office.  EDCM called PCP office to find that pt has appointment 12/12 @ 1:20.  [facility] will remind pt of upcoming appointment.

## 2018-06-24 NOTE — ED Notes (Signed)
WOC RN Dawn at bedside to eval

## 2018-06-24 NOTE — Consult Note (Addendum)
[facility] Nurse ostomy consult note Pt is familiar to [facility] team from recent ostomy surgery in June.  He states he recently developed redness, itching and burning around the stoma site.  He is due to be reversed in approx a month, according to the patient, and does not have insurance and this has been a barrier for obtaining followup and supplies. Pt has been using a one piece flat pouch, which was currently leaking behind the barrier. Stoma type/location:  Colostomy stoma is red and viable, flush with skin level, 1 1/2 inches and slightly oval.  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 Treatment options for stomal/peristomal skin: Discussed protecting with crusting technique, using skin prep and ostomy powder.  Girlfriend states she has these supplies at home. Demonstrated use of adding a barrier ring. Output: Mod amt semiformed brown stool Ostomy pouching: Pt should continue to use a one piece flexible pouch and add a barrier ring to attempt to maintain a seal.  I did not have a one piece pouch with me, so I applied a 2 piece pouching system, and provided the patient with 2 extra wafers, barrier rings and pouches. He states they know how to order supplies.  Provided him with the contact information for the [facility] program, which must be arranged by the patient via phone call.  Pt and girlfriend asked appropriate questions regarding protecting skin and are independent with pouch application and emptying. Please re-consult if further assistance is needed.  Thank-you,  Julien Girt MSN, [place], [place], [place], [place]
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
## 2018-06-24 NOTE — ED Notes (Signed)
Pt verbalized understanding of discharge paperwork and follow-up care.  °

## 2018-06-24 NOTE — ED Provider Notes (Signed)
  Face-to-face evaluation   History:   Physical exam:  Medical screening examination/treatment/procedure(s) were conducted as a shared visit with non-physician practitioner(s) and myself.  I personally evaluated the patient during the encounter   Daleen Bo, MD 06/24/18 1159

## 2018-06-24 NOTE — Discharge Instructions (Signed)
Follow skin care instructions as discussed by the ostomy nurse.  Follow up with your surgeon.  Apply the powder as directed to your skin. You can try using a wet wash cloth when removing the ostomy bag to avoid pulling your skin too much.

## 2018-06-24 NOTE — ED Triage Notes (Signed)
Pt had had colostomy  bag since June of this year- pt reports irritation and burning where adherent bandage is.

## 2018-06-27 ENCOUNTER — Encounter: Payer: Medicaid Other | Admitting: Physical Medicine & Rehabilitation

## 2018-07-08 ENCOUNTER — Telehealth: Payer: Self-pay | Admitting: Physical Medicine & Rehabilitation

## 2018-07-11 ENCOUNTER — Encounter: Payer: Self-pay | Attending: Physical Medicine & Rehabilitation | Admitting: Physical Medicine & Rehabilitation

## 2018-07-11 DIAGNOSIS — R5381 Other malaise: Secondary | ICD-10-CM | POA: Insufficient documentation

## 2018-07-11 DIAGNOSIS — I82401 Acute embolism and thrombosis of unspecified deep veins of right lower extremity: Secondary | ICD-10-CM | POA: Insufficient documentation

## 2018-07-11 DIAGNOSIS — S31139A Puncture wound of abdominal wall without foreign body, unspecified quadrant without penetration into peritoneal cavity, initial encounter: Secondary | ICD-10-CM | POA: Insufficient documentation

## 2018-07-11 DIAGNOSIS — Z79891 Long term (current) use of opiate analgesic: Secondary | ICD-10-CM | POA: Insufficient documentation

## 2018-07-11 DIAGNOSIS — F329 Major depressive disorder, single episode, unspecified: Secondary | ICD-10-CM | POA: Insufficient documentation

## 2018-07-11 DIAGNOSIS — F17210 Nicotine dependence, cigarettes, uncomplicated: Secondary | ICD-10-CM | POA: Insufficient documentation

## 2018-07-11 DIAGNOSIS — M25562 Pain in left knee: Secondary | ICD-10-CM | POA: Insufficient documentation

## 2018-07-11 DIAGNOSIS — F411 Generalized anxiety disorder: Secondary | ICD-10-CM | POA: Insufficient documentation

## 2018-07-11 DIAGNOSIS — Z933 Colostomy status: Secondary | ICD-10-CM | POA: Insufficient documentation

## 2018-07-11 DIAGNOSIS — R109 Unspecified abdominal pain: Secondary | ICD-10-CM | POA: Insufficient documentation

## 2018-07-11 DIAGNOSIS — M79671 Pain in right foot: Secondary | ICD-10-CM | POA: Insufficient documentation

## 2018-07-11 DIAGNOSIS — R339 Retention of urine, unspecified: Secondary | ICD-10-CM | POA: Insufficient documentation

## 2018-07-11 DIAGNOSIS — M25511 Pain in right shoulder: Secondary | ICD-10-CM | POA: Insufficient documentation

## 2018-07-11 DIAGNOSIS — Z79899 Other long term (current) drug therapy: Secondary | ICD-10-CM | POA: Insufficient documentation

## 2018-07-11 DIAGNOSIS — M545 Low back pain: Secondary | ICD-10-CM | POA: Insufficient documentation

## 2018-07-11 DIAGNOSIS — F4310 Post-traumatic stress disorder, unspecified: Secondary | ICD-10-CM | POA: Insufficient documentation

## 2018-09-19 ENCOUNTER — Other Ambulatory Visit: Payer: Self-pay | Admitting: General Surgery

## 2018-09-19 ENCOUNTER — Other Ambulatory Visit (HOSPITAL_COMMUNITY): Payer: Self-pay | Admitting: General Surgery

## 2018-09-19 DIAGNOSIS — Z933 Colostomy status: Secondary | ICD-10-CM

## 2018-09-27 ENCOUNTER — Other Ambulatory Visit: Payer: Self-pay

## 2018-09-27 ENCOUNTER — Ambulatory Visit (HOSPITAL_COMMUNITY)
Admission: RE | Admit: 2018-09-27 | Discharge: 2018-09-27 | Disposition: A | Discharge status: Home / Self Care | Payer: Medicaid Other | Source: Ambulatory Visit | Attending: General Surgery | Admitting: General Surgery

## 2018-09-27 DIAGNOSIS — Z933 Colostomy status: Secondary | ICD-10-CM | POA: Insufficient documentation

## 2018-09-27 MED ORDER — IOHEXOL 300 MG/ML  SOLN
600.0000 mL | Freq: Once | INTRAMUSCULAR | Status: AC | PRN
  Administered 2018-09-27: 600 mL via ORAL

## 2018-10-03 ENCOUNTER — Ambulatory Visit: Payer: Medicaid Other | Admitting: Family Medicine

## 2018-11-14 ENCOUNTER — Ambulatory Visit: Payer: Medicaid Other | Admitting: Family Medicine

## 2018-12-18 ENCOUNTER — Other Ambulatory Visit: Payer: Self-pay

## 2018-12-18 ENCOUNTER — Encounter: Payer: Self-pay | Admitting: Family Medicine

## 2018-12-18 ENCOUNTER — Ambulatory Visit: Payer: Self-pay | Attending: Family Medicine | Admitting: Family Medicine

## 2018-12-18 DIAGNOSIS — W3400XS Accidental discharge from unspecified firearms or gun, sequela: Secondary | ICD-10-CM

## 2018-12-18 DIAGNOSIS — Z933 Colostomy status: Secondary | ICD-10-CM

## 2018-12-18 DIAGNOSIS — S31139S Puncture wound of abdominal wall without foreign body, unspecified quadrant without penetration into peritoneal cavity, sequela: Secondary | ICD-10-CM

## 2018-12-18 DIAGNOSIS — G8921 Chronic pain due to trauma: Secondary | ICD-10-CM

## 2018-12-18 DIAGNOSIS — F4310 Post-traumatic stress disorder, unspecified: Secondary | ICD-10-CM

## 2018-12-18 DIAGNOSIS — S31139A Puncture wound of abdominal wall without foreign body, unspecified quadrant without penetration into peritoneal cavity, initial encounter: Secondary | ICD-10-CM

## 2018-12-18 MED ORDER — FAMOTIDINE 20 MG PO TABS: 20.0000 mg | ORAL_TABLET | Freq: Every day | ORAL | 3 refills | Status: DC

## 2018-12-18 MED ORDER — FLUOXETINE HCL 10 MG PO CAPS: 10.0000 mg | ORAL_CAPSULE | Freq: Every day | ORAL | 3 refills | Status: DC
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
## 2018-12-23 ENCOUNTER — Telehealth: Payer: Self-pay

## 2018-12-23 NOTE — Telephone Encounter (Signed)
At request of Dr Chapman Fitch, this CM attempted to contact patient to provide him with information about  [employer] ostomy supplies and inform him of the services that [facility] provides. Left message with his mother requesting that he contact this CM # [phone]

## 2018-12-24 ENCOUNTER — Telehealth: Payer: Self-pay

## 2018-12-24 NOTE — Telephone Encounter (Signed)
Attempted to contact patient to provide him with information about  [employer] ostomy supplies and inform him of the services that [facility] provides. Left message with his sister, DeeDee  requesting that he contact this CM # [phone].  [facility] stated that he was not home at the time

## 2019-01-22 ENCOUNTER — Other Ambulatory Visit
Admission: RE | Admit: 2019-01-22 | Discharge: 2019-01-22 | Disposition: A | Discharge status: Home / Self Care | Payer: Medicaid Other | Source: Ambulatory Visit | Attending: Surgery | Admitting: Surgery

## 2019-01-22 ENCOUNTER — Ambulatory Visit (INDEPENDENT_AMBULATORY_CARE_PROVIDER_SITE_OTHER): Payer: Self-pay | Admitting: Surgery

## 2019-01-22 ENCOUNTER — Encounter (INDEPENDENT_AMBULATORY_CARE_PROVIDER_SITE_OTHER): Payer: Self-pay

## 2019-01-22 ENCOUNTER — Encounter: Payer: Self-pay | Admitting: Surgery

## 2019-01-22 ENCOUNTER — Other Ambulatory Visit: Payer: Self-pay

## 2019-01-22 VITALS — BP 125/74 | HR 65 | Temp 97.9°F | Ht 67.0 in | Wt 202.0 lb

## 2019-01-22 DIAGNOSIS — K439 Ventral hernia without obstruction or gangrene: Secondary | ICD-10-CM

## 2019-01-22 LAB — COMPREHENSIVE METABOLIC PANEL
ALT: 12 U/L (ref 0–44)
AST: 17 U/L (ref 15–41)
Albumin: 4.2 g/dL (ref 3.5–5.0)
Alkaline Phosphatase: 61 U/L (ref 38–126)
Anion gap: 6 (ref 5–15)
BUN: 17 mg/dL (ref 6–20)
CO2: 25 mmol/L (ref 22–32)
Calcium: 9.1 mg/dL (ref 8.9–10.3)
Chloride: 107 mmol/L (ref 98–111)
Creatinine, Ser: 0.93 mg/dL (ref 0.61–1.24)
GFR calc Af Amer: >60 mL/min (ref >60)
GFR calc non Af Amer: >60 mL/min (ref >60)
Glucose, Bld: 98 mg/dL (ref 70–99)
Potassium: 4.4 mmol/L (ref 3.5–5.1)
Sodium: 138 mmol/L (ref 135–145)
Total Bilirubin: 0.6 mg/dL (ref 0.3–1.2)
Total Protein: 7.3 g/dL (ref 6.5–8.1)

## 2019-01-22 LAB — CBC WITH DIFFERENTIAL/PLATELET
Abs Immature Granulocytes: 0.04 10*3/uL (ref 0.00–0.07)
Basophils Absolute: 0 10*3/uL (ref 0.0–0.1)
Basophils Relative: 0 %
Eosinophils Absolute: 0.1 10*3/uL (ref 0.0–0.5)
Eosinophils Relative: 2 %
HCT: 45.5 % (ref 39.0–52.0)
Hemoglobin: 14.4 g/dL (ref 13.0–17.0)
Immature Granulocytes: 0 %
Lymphocytes Relative: 22 %
Lymphs Abs: 2 10*3/uL (ref 0.7–4.0)
MCH: 26.6 pg (ref 26.0–34.0)
MCHC: 31.6 g/dL (ref 30.0–36.0)
MCV: 83.9 fL (ref 80.0–100.0)
Monocytes Absolute: 0.7 10*3/uL (ref 0.1–1.0)
Monocytes Relative: 8 %
Neutro Abs: 6 10*3/uL (ref 1.7–7.7)
Neutrophils Relative %: 68 %
Platelets: 296 10*3/uL (ref 150–400)
RBC: 5.42 MIL/uL (ref 4.22–5.81)
RDW: 13.6 % (ref 11.5–15.5)
WBC: 8.9 10*3/uL (ref 4.0–10.5)
nRBC: 0 % (ref 0.0–0.2)

## 2019-01-22 NOTE — Patient Instructions (Addendum)
The patient is aware to call back for any questions or new concerns. Labs at [facility]. Check in at the [facility] entrance.  CT abdomen pelvis iv/oral contrast on 01-30-19 arrive at 12:45 pm. Prep: nothing to eat or drink 4 hours prior and pick up prep kit at [facility] radiology desk (2nd desk on right in the [place]). Dr. Vicente Males for preop Colonoscopy (their office will call you to arrange).
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
## 2019-01-27 ENCOUNTER — Ambulatory Visit: Payer: Self-pay | Admitting: Surgery

## 2019-01-30 ENCOUNTER — Other Ambulatory Visit: Payer: Self-pay

## 2019-01-30 ENCOUNTER — Ambulatory Visit
Admission: RE | Admit: 2019-01-30 | Discharge: 2019-01-30 | Disposition: A | Discharge status: Home / Self Care | Payer: Self-pay | Source: Ambulatory Visit | Attending: Surgery | Admitting: Surgery

## 2019-01-30 ENCOUNTER — Ambulatory Visit (INDEPENDENT_AMBULATORY_CARE_PROVIDER_SITE_OTHER): Payer: Medicaid Other | Admitting: Gastroenterology

## 2019-01-30 DIAGNOSIS — K439 Ventral hernia without obstruction or gangrene: Secondary | ICD-10-CM

## 2019-01-30 DIAGNOSIS — Z9889 Other specified postprocedural states: Secondary | ICD-10-CM

## 2019-01-30 MED ORDER — IOHEXOL 300 MG/ML  SOLN
100.0000 mL | Freq: Once | INTRAMUSCULAR | Status: AC | PRN
  Administered 2019-01-30: 100 mL via INTRAVENOUS
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
## 2019-01-31 ENCOUNTER — Telehealth: Payer: Self-pay | Admitting: *Deleted

## 2019-01-31 ENCOUNTER — Telehealth: Payer: Self-pay | Admitting: Surgery

## 2019-01-31 NOTE — Telephone Encounter (Signed)
Notified patient as instructed, patient pleased. CT scan Known hernia .Discussed follow-up appointments, patient agrees

## 2019-01-31 NOTE — Telephone Encounter (Signed)
Patient called asking for you to return his call when you have a chancew. Please call patient and advise.

## 2019-01-31 NOTE — Telephone Encounter (Signed)
Patient had a miss call.

## 2019-02-04 ENCOUNTER — Telehealth: Payer: Self-pay | Admitting: Gastroenterology

## 2019-02-04 NOTE — Telephone Encounter (Signed)
Patient called & would like his colonoscopy scheduled before 02-12-19 when he has surgery scheduled with Dr Dahlia Byes.

## 2019-02-05 ENCOUNTER — Other Ambulatory Visit: Payer: Self-pay

## 2019-02-05 DIAGNOSIS — Z9889 Other specified postprocedural states: Secondary | ICD-10-CM

## 2019-02-05 DIAGNOSIS — K439 Ventral hernia without obstruction or gangrene: Secondary | ICD-10-CM

## 2019-02-05 MED ORDER — NA SULFATE-K SULFATE-MG SULF 17.5-3.13-1.6 GM/177ML PO SOLN: 1.0000 | Freq: Once | ORAL | 0 refills | Status: AC

## 2019-02-05 NOTE — Telephone Encounter (Signed)
Unable to contact pt but, was able to speak with his wife, Otila Kluver. We have scheduled pt procedure. Pt's wife is aware that pt is required to go for the COVID test on 02-06-19.

## 2019-02-06 ENCOUNTER — Other Ambulatory Visit
Admission: RE | Admit: 2019-02-06 | Discharge: 2019-02-06 | Disposition: A | Discharge status: Home / Self Care | Payer: HRSA Program | Source: Ambulatory Visit | Attending: Gastroenterology | Admitting: Gastroenterology

## 2019-02-06 ENCOUNTER — Telehealth: Payer: Self-pay | Admitting: Gastroenterology

## 2019-02-06 ENCOUNTER — Other Ambulatory Visit: Payer: Self-pay

## 2019-02-06 DIAGNOSIS — Z1159 Encounter for screening for other viral diseases: Secondary | ICD-10-CM | POA: Diagnosis present

## 2019-02-06 LAB — SARS CORONAVIRUS 2 (TAT 6-24 HRS): SARS Coronavirus 2: NEGATIVE

## 2019-02-06 NOTE — Telephone Encounter (Addendum)
Spoke with pt and confirmed pt procedure date and reminded him that the prescription for the bowel prep kit has been sent to his preferred pharmacy.

## 2019-02-06 NOTE — Telephone Encounter (Signed)
Pt left vm to check on his procedure date and time he thinks it is on 02/10/19 and he needs to get a prep kit please call pt

## 2019-02-07 ENCOUNTER — Other Ambulatory Visit: Payer: Self-pay

## 2019-02-07 MED ORDER — PEG 3350-KCL-NA BICARB-NACL 420 G PO SOLR: ORAL | 0 refills | Status: DC

## 2019-02-10 ENCOUNTER — Other Ambulatory Visit: Payer: Self-pay

## 2019-02-10 ENCOUNTER — Telehealth: Payer: Self-pay | Admitting: Gastroenterology

## 2019-02-10 ENCOUNTER — Ambulatory Visit: Payer: Medicaid Other | Admitting: Anesthesiology

## 2019-02-10 ENCOUNTER — Encounter: Payer: Self-pay | Admitting: *Deleted

## 2019-02-10 ENCOUNTER — Encounter
Admission: RE | Disposition: A | Discharge status: Home / Self Care | Payer: Self-pay | Source: Home / Self Care | Attending: Gastroenterology

## 2019-02-10 ENCOUNTER — Ambulatory Visit
Admission: RE | Admit: 2019-02-10 | Discharge: 2019-02-10 | Disposition: A | Discharge status: Home / Self Care | Payer: Medicaid Other | Attending: Gastroenterology | Admitting: Gastroenterology

## 2019-02-10 DIAGNOSIS — K439 Ventral hernia without obstruction or gangrene: Secondary | ICD-10-CM

## 2019-02-10 DIAGNOSIS — Z791 Long term (current) use of non-steroidal anti-inflammatories (NSAID): Secondary | ICD-10-CM | POA: Insufficient documentation

## 2019-02-10 DIAGNOSIS — F419 Anxiety disorder, unspecified: Secondary | ICD-10-CM | POA: Insufficient documentation

## 2019-02-10 DIAGNOSIS — Z8249 Family history of ischemic heart disease and other diseases of the circulatory system: Secondary | ICD-10-CM | POA: Insufficient documentation

## 2019-02-10 DIAGNOSIS — Z9889 Other specified postprocedural states: Secondary | ICD-10-CM

## 2019-02-10 DIAGNOSIS — Z809 Family history of malignant neoplasm, unspecified: Secondary | ICD-10-CM | POA: Insufficient documentation

## 2019-02-10 DIAGNOSIS — Z833 Family history of diabetes mellitus: Secondary | ICD-10-CM | POA: Insufficient documentation

## 2019-02-10 DIAGNOSIS — K621 Rectal polyp: Secondary | ICD-10-CM | POA: Insufficient documentation

## 2019-02-10 DIAGNOSIS — F329 Major depressive disorder, single episode, unspecified: Secondary | ICD-10-CM | POA: Insufficient documentation

## 2019-02-10 DIAGNOSIS — Z79899 Other long term (current) drug therapy: Secondary | ICD-10-CM | POA: Insufficient documentation

## 2019-02-10 DIAGNOSIS — Z91030 Bee allergy status: Secondary | ICD-10-CM | POA: Insufficient documentation

## 2019-02-10 DIAGNOSIS — Z01818 Encounter for other preprocedural examination: Secondary | ICD-10-CM

## 2019-02-10 DIAGNOSIS — Z87891 Personal history of nicotine dependence: Secondary | ICD-10-CM | POA: Insufficient documentation

## 2019-02-10 DIAGNOSIS — Z1211 Encounter for screening for malignant neoplasm of colon: Secondary | ICD-10-CM | POA: Insufficient documentation

## 2019-02-10 DIAGNOSIS — Z933 Colostomy status: Secondary | ICD-10-CM | POA: Insufficient documentation

## 2019-02-10 HISTORY — PX: COLONOSCOPY WITH PROPOFOL: SHX5780

## 2019-02-10 SURGERY — COLONOSCOPY WITH PROPOFOL
Anesthesia: General

## 2019-02-10 MED ORDER — PROPOFOL 500 MG/50ML IV EMUL
INTRAVENOUS | Status: DC | PRN
  Administered 2019-02-10: 150 ug/kg/min via INTRAVENOUS

## 2019-02-10 MED ORDER — LIDOCAINE HCL (CARDIAC) PF 100 MG/5ML IV SOSY
PREFILLED_SYRINGE | INTRAVENOUS | Status: DC | PRN
  Administered 2019-02-10: 100 mg via INTRATRACHEAL

## 2019-02-10 MED ORDER — SODIUM CHLORIDE 0.9 % IV SOLN
INTRAVENOUS | Status: DC
  Administered 2019-02-10: 1000 mL via INTRAVENOUS

## 2019-02-10 MED ORDER — PROPOFOL 10 MG/ML IV BOLUS
INTRAVENOUS | Status: DC | PRN
  Administered 2019-02-10: 50 mg via INTRAVENOUS
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
## 2019-02-10 NOTE — Transfer of Care (Signed)
Immediate Anesthesia Transfer of Care Note  Patient: [name]  Procedure(s) Performed: COLONOSCOPY WITH PROPOFOL (N/A )  Patient Location: PACU  Anesthesia Type:General  Level of Consciousness: awake, alert  and confused  Airway & Oxygen Therapy: Patient Spontanous Breathing and Patient connected to nasal cannula oxygen  Post-op Assessment: Report given to RN and Patient moving all extremities X 4  Post vital signs: Reviewed and stable  Last Vitals:  Vitals Value Taken Time  BP 118/85 02/10/19 1059  Temp 36.2 C 02/10/19 1059  Pulse 64 02/10/19 1105  Resp 22 02/10/19 1105  SpO2 100 % 02/10/19 1105  Vitals shown include unvalidated device data.  Last Pain:  Vitals:   02/10/19 1059  TempSrc: Tympanic  PainSc: 0-No pain         Complications: No apparent anesthesia complications

## 2019-02-10 NOTE — Anesthesia Post-op Follow-up Note (Signed)
Anesthesia QCDR form completed.        
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
## 2019-02-10 NOTE — Telephone Encounter (Signed)
Pt left vm to find out what time his colonoscopy apt is

## 2019-02-10 NOTE — Anesthesia Postprocedure Evaluation (Signed)
Anesthesia Post Note  Patient: [name]  Procedure(s) Performed: COLONOSCOPY WITH PROPOFOL (N/A )  Patient location during evaluation: Endoscopy Anesthesia Type: General Level of consciousness: awake and alert Pain management: pain level controlled Vital Signs Assessment: post-procedure vital signs reviewed and stable Respiratory status: spontaneous breathing, nonlabored ventilation, respiratory function stable and patient connected to nasal cannula oxygen Cardiovascular status: blood pressure returned to baseline and stable Postop Assessment: no apparent nausea or vomiting Anesthetic complications: no     Last Vitals:  Vitals:   02/10/19 1119 02/10/19 1129  BP: 121/90 (!) 139/92  Pulse: (!) 56 63  Resp: 14 15  Temp:    SpO2: 100% 100%    Last Pain:  Vitals:   02/10/19 1129  TempSrc:   PainSc: 0-No pain                 Precious Haws Piscitello

## 2019-02-10 NOTE — Op Note (Signed)
[facility] Gastroenterology Patient Name: [name] Procedure Date: 02/10/2019 10:26 AM MRN: [id number] Account #: [id number] Date of Birth: 03-May-1971 Admit Type: Outpatient Age: 48 Room: [facility] ENDO ROOM 4 Gender: Male Note Status: Finalized Procedure:            Colonoscopy Indications:          Preoperative assessment Providers:            Jonathon Bellows MD, MD Referring MD:         Antony Blackbird (Referring MD) Medicines:            Monitored Anesthesia Care Complications:        No immediate complications. Procedure:            Pre-Anesthesia Assessment:                       - Prior to the procedure, a History and Physical was                        performed, and patient medications, allergies and                        sensitivities were reviewed. The patient's tolerance of                        previous anesthesia was reviewed.                       - The risks and benefits of the procedure and the                        sedation options and risks were discussed with the                        patient. All questions were answered and informed                        consent was obtained.                       - ASA Grade Assessment: II - A patient with mild                        systemic disease.                       After obtaining informed consent, the colonoscope was                        passed under direct vision. Throughout the procedure,                        the patient's blood pressure, pulse, and oxygen                        saturations were monitored continuously. The                        Colonoscope was introduced through the descending                        colostomy and advanced to  the the cecum, identified by                        the appendiceal orifice, IC valve and                        transillumination. The colonoscopy was performed with                        ease. The patient tolerated the procedure well. The      quality of the bowel preparation was excellent. Findings:      The perianal and digital rectal examinations were normal.      Scope passed through anus and reached end of distal sigmoid colon -      A 3 mm polyp was found in the rectum. The polyp was sessile. The polyp       was removed with a cold biopsy forceps. Resection and retrieval were       complete.      Scope then reinserted through colostomy      The exam was otherwise without abnormality on direct and retroflexion       views. Impression:           - One 3 mm polyp in the rectum, removed with a cold                        biopsy forceps. Resected and retrieved.                       - The examination was otherwise normal on direct and                        retroflexion views. Recommendation:       - Discharge patient to home (with escort).                       - Resume previous diet.                       - Continue present medications.                       - Await pathology results.                       - Repeat colonoscopy [day] for surveillance based on                        pathology results.                       - Can proceed with reversal of ostomy Procedure Code(s):    --- Professional ---                       [phone], Colonoscopy through stoma; with biopsy, single                        or multiple Diagnosis Code(s):    --- Professional ---                       K62.1, Rectal polyp  R83.818, Encounter for other preprocedural examination CPT copyright 2019 American Medical Association. All rights reserved. The codes documented in this report are preliminary and upon coder review may  be revised to meet current compliance requirements. Jonathon Bellows, MD Jonathon Bellows MD, MD 02/10/2019 10:53:49 AM This report has been signed electronically. Number of Addenda: 0 Note Initiated On: 02/10/2019 10:26 AM Scope Withdrawal Time: 0 hours 7 minutes 11 seconds  Total Procedure Duration: 0 hours 14  minutes 2 seconds  Estimated Blood Loss: Estimated blood loss: none.      [facility]

## 2019-02-11 LAB — SURGICAL PATHOLOGY

## 2019-02-12 ENCOUNTER — Other Ambulatory Visit: Payer: Self-pay

## 2019-02-12 ENCOUNTER — Ambulatory Visit (INDEPENDENT_AMBULATORY_CARE_PROVIDER_SITE_OTHER): Payer: Self-pay | Admitting: Surgery

## 2019-02-12 ENCOUNTER — Telehealth: Payer: Self-pay | Admitting: *Deleted

## 2019-02-12 ENCOUNTER — Encounter: Payer: Self-pay | Admitting: *Deleted

## 2019-02-12 ENCOUNTER — Encounter: Payer: Self-pay | Admitting: Surgery

## 2019-02-12 VITALS — BP 116/69 | HR 60 | Temp 97.5°F | Ht 69.0 in | Wt 201.0 lb

## 2019-02-12 DIAGNOSIS — K439 Ventral hernia without obstruction or gangrene: Secondary | ICD-10-CM

## 2019-02-12 MED ORDER — ERYTHROMYCIN BASE COATED 500 MG PO TBEC: DELAYED_RELEASE_TABLET | ORAL | 0 refills | Status: DC

## 2019-02-12 MED ORDER — BISACODYL EC 5 MG PO TBEC: 5.0000 mg | DELAYED_RELEASE_TABLET | ORAL | 0 refills | Status: DC

## 2019-02-12 MED ORDER — NEOMYCIN SULFATE 500 MG PO TABS: ORAL_TABLET | ORAL | 0 refills | Status: DC

## 2019-02-12 MED ORDER — POLYETHYLENE GLYCOL 3350 17 GM/SCOOP PO POWD: ORAL | 0 refills | Status: DC

## 2019-02-12 NOTE — Progress Notes (Signed)
Patient's surgery to be scheduled for 03-06-19 at [facility] with Dr. Dahlia Byes. Dr. Nestor Lewandowsky will be assisting with this case.   The patient has been asked to complete a formal bowel prep with antibiotics in addition to a fleets enema.   The patient is aware to have COVID-19 testing done on 03-03-19 at the [place] building drive thru ([address] [place]). We will try and coordinate this with his Pre-admit appointment. He is aware to isolate after, have no visitors, wash hands frequently, and avoid touching face.   The patient is aware he will need to Pre-Admit. Patient will check in at the [place] entrance due to COVID-19 restrictions and will then be escorted to the [place], [address] (first floor). Patient will be contacted once Pre-admission appointment has been arranged with date and time.   Patient aware to be NPO after midnight and have a driver.   He is aware to check in at the [place] entrance where he will be screened for the coronavirus and then sent to Same Day Surgery.   Patient aware that he may have one visitor due to COVID-19 restrictions.   The patient verbalizes understanding of the above.   The patient is aware to call the office should he have further questions.   *Patient called the office back after he left and stated that he already has a prescription for neomycin and metronidazole. The patient is aware he can use these and not have to pay for another prescription. He will use the metronidazole in place of erythromycin. Patient verbalizes understanding.
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
## 2019-02-12 NOTE — Telephone Encounter (Signed)
Patient contacted and notified that his Pre-admit appointment and COVID testing is scheduled for 03-03-19 at 8:45 am.   The patient verbalizes understanding and has no questions at this time.

## 2019-02-13 ENCOUNTER — Encounter: Payer: Self-pay | Admitting: Gastroenterology

## 2019-02-17 ENCOUNTER — Encounter: Payer: Self-pay | Admitting: Gastroenterology

## 2019-03-03 ENCOUNTER — Other Ambulatory Visit
Admission: RE | Admit: 2019-03-03 | Discharge: 2019-03-03 | Disposition: A | Discharge status: Home / Self Care | Payer: HRSA Program | Source: Ambulatory Visit | Attending: Surgery | Admitting: Surgery

## 2019-03-03 ENCOUNTER — Encounter
Admission: RE | Admit: 2019-03-03 | Discharge: 2019-03-03 | Disposition: A | Discharge status: Home / Self Care | Payer: Medicaid Other | Source: Ambulatory Visit | Attending: Surgery | Admitting: Surgery

## 2019-03-03 ENCOUNTER — Other Ambulatory Visit: Payer: Self-pay

## 2019-03-03 DIAGNOSIS — Z01812 Encounter for preprocedural laboratory examination: Secondary | ICD-10-CM | POA: Insufficient documentation

## 2019-03-03 DIAGNOSIS — Z01818 Encounter for other preprocedural examination: Secondary | ICD-10-CM | POA: Insufficient documentation

## 2019-03-03 DIAGNOSIS — Z20828 Contact with and (suspected) exposure to other viral communicable diseases: Secondary | ICD-10-CM | POA: Insufficient documentation

## 2019-03-03 LAB — SARS CORONAVIRUS 2 (TAT 6-24 HRS): SARS Coronavirus 2: NEGATIVE

## 2019-03-03 NOTE — Patient Instructions (Signed)
Your procedure is scheduled on:03/06/19 Report to Day Surgery.[facility]OND FLOOR To find out your arrival time please call [phone] between 1PM - 3PM on 03/05/19.  Remember: Instructions that are not followed completely may result in serious medical risk,  up to and including death, or upon the discretion of your surgeon and anesthesiologist your  surgery may need to be rescheduled.     _X__ 1. Do not eat food after midnight the night before your procedure.                 No gum chewing or hard candies. You may drink clear liquids up to 2 hours                 before you are scheduled to arrive for your surgery- DO not drink clear                 liquids within 2 hours of the start of your surgery.                 Clear Liquids include:  water, apple juice without pulp, clear carbohydrate                 drink such as Clearfast of Gatorade, Black Coffee or Tea (Do not add                 anything to coffee or tea).  __X__2.  On the morning of surgery brush your teeth with toothpaste and water, you                may rinse your mouth with mouthwash if you wish.  Do not swallow any toothpaste of mouthwash.     _X__ 3.  No Alcohol for 24 hours before or after surgery.   _X__ 4.  Do Not Smoke or use e-cigarettes For 24 Hours Prior to Your Surgery.                 Do not use any chewable tobacco products for at least 6 hours prior to                 surgery.  ____  5.  Bring all medications with you on the day of surgery if instructed.   _X___  6.  Notify your doctor if there is any change in your medical condition      (cold, fever, infections).     Do not wear jewelry, make-up, hairpins, clips or nail polish. Do not wear lotions, powders, or perfumes. You may wear deodorant. Do not shave 48 hours prior to surgery. Men may shave face and neck. Do not bring valuables to the hospital.    [facility] is not responsible for any belongings or  valuables.  Contacts, dentures or bridgework may not be worn into surgery. Leave your suitcase in the car. After surgery it may be brought to your room. For patients admitted to the hospital, discharge time is determined by your treatment team.   Patients discharged the day of surgery will not be allowed to drive home.   Please read over the following fact sheets that you were given:   Surgical Site Infection Prevention / SPIROMETRY         ____ Take these medicines the morning of surgery with A SIP OF WATER:    1. PEPCID  2. PROZAC  3.   4.  5.  6.  ____ Fleet Enema (as directed)   _X___  Use CHG Soap as directed  ____ Use inhalers on the day of surgery  ____ Stop metformin 2 days prior to surgery    ____ Take 1/2 of usual insulin dose the night before surgery. No insulin the morning          of surgery.   ____ Stop Coumadin/Plavix/aspirin on   ____ Stop Anti-inflammatories on    ____ Stop supplements until after surgery.    ____ Bring C-Pap to the hospital.   PRACTICE SPIROMETRY AND BRING MORNING OF SURGERY

## 2019-03-05 MED ORDER — SODIUM CHLORIDE 0.9 % IV SOLN
1.0000 g | INTRAVENOUS | Status: AC
  Administered 2019-03-06: 1 g via INTRAVENOUS
  Filled 2019-03-05: qty 1

## 2019-03-06 ENCOUNTER — Other Ambulatory Visit: Payer: Self-pay

## 2019-03-06 ENCOUNTER — Inpatient Hospital Stay: Payer: Self-pay | Admitting: Certified Registered Nurse Anesthetist

## 2019-03-06 ENCOUNTER — Inpatient Hospital Stay
Admission: RE | Admit: 2019-03-06 | Discharge: 2019-03-14 | DRG: 330 | Disposition: A | Discharge status: Home / Self Care | Payer: Self-pay | Attending: Surgery | Admitting: Surgery

## 2019-03-06 ENCOUNTER — Encounter: Payer: Self-pay | Admitting: Emergency Medicine

## 2019-03-06 ENCOUNTER — Encounter
Admission: RE | Disposition: A | Discharge status: Home / Self Care | Payer: Self-pay | Source: Home / Self Care | Attending: Surgery

## 2019-03-06 DIAGNOSIS — K432 Incisional hernia without obstruction or gangrene: Secondary | ICD-10-CM | POA: Diagnosis present

## 2019-03-06 DIAGNOSIS — K439 Ventral hernia without obstruction or gangrene: Secondary | ICD-10-CM

## 2019-03-06 DIAGNOSIS — K9409 Other complications of colostomy: Principal | ICD-10-CM | POA: Diagnosis present

## 2019-03-06 DIAGNOSIS — Z91030 Bee allergy status: Secondary | ICD-10-CM

## 2019-03-06 DIAGNOSIS — K435 Parastomal hernia without obstruction or  gangrene: Secondary | ICD-10-CM | POA: Diagnosis present

## 2019-03-06 DIAGNOSIS — Z20828 Contact with and (suspected) exposure to other viral communicable diseases: Secondary | ICD-10-CM | POA: Diagnosis present

## 2019-03-06 DIAGNOSIS — F1290 Cannabis use, unspecified, uncomplicated: Secondary | ICD-10-CM | POA: Diagnosis present

## 2019-03-06 DIAGNOSIS — Z9889 Other specified postprocedural states: Secondary | ICD-10-CM

## 2019-03-06 DIAGNOSIS — Z7289 Other problems related to lifestyle: Secondary | ICD-10-CM

## 2019-03-06 DIAGNOSIS — Z87891 Personal history of nicotine dependence: Secondary | ICD-10-CM

## 2019-03-06 DIAGNOSIS — F419 Anxiety disorder, unspecified: Secondary | ICD-10-CM | POA: Diagnosis present

## 2019-03-06 DIAGNOSIS — K567 Ileus, unspecified: Secondary | ICD-10-CM | POA: Diagnosis not present

## 2019-03-06 DIAGNOSIS — F329 Major depressive disorder, single episode, unspecified: Secondary | ICD-10-CM | POA: Diagnosis present

## 2019-03-06 HISTORY — PX: COLOSTOMY TAKEDOWN: SHX5783

## 2019-03-06 HISTORY — PX: APPENDECTOMY: SHX54

## 2019-03-06 HISTORY — PX: ABDOMINAL WALL DEFECT REPAIR: SHX53

## 2019-03-06 LAB — URINE DRUG SCREEN, QUALITATIVE (ARMC ONLY)
Amphetamines, Ur Screen: NOT DETECTED
Barbiturates, Ur Screen: NOT DETECTED
Benzodiazepine, Ur Scrn: NOT DETECTED
Cocaine Metabolite,Ur ~~LOC~~: NOT DETECTED
MDMA (Ecstasy)Ur Screen: NOT DETECTED
Methadone Scn, Ur: NOT DETECTED
Opiate, Ur Screen: NOT DETECTED
Phencyclidine (PCP) Ur S: NOT DETECTED
Tricyclic, Ur Screen: NOT DETECTED

## 2019-03-06 SURGERY — CLOSURE, COLOSTOMY
Anesthesia: General | Site: Abdomen

## 2019-03-06 MED ORDER — GABAPENTIN 300 MG PO CAPS
300.0000 mg | ORAL_CAPSULE | ORAL | Status: AC
  Administered 2019-03-06: 08:00:00 300 mg via ORAL

## 2019-03-06 MED ORDER — SODIUM CHLORIDE (PF) 0.9 % IJ SOLN
INTRAMUSCULAR | Status: AC
  Filled 2019-03-06: qty 50

## 2019-03-06 MED ORDER — BUPIVACAINE-EPINEPHRINE (PF) 0.25% -1:200000 IJ SOLN
INTRAMUSCULAR | Status: AC
  Filled 2019-03-06: qty 30

## 2019-03-06 MED ORDER — KETOROLAC TROMETHAMINE 30 MG/ML IJ SOLN
30.0000 mg | Freq: Four times a day (QID) | INTRAMUSCULAR | Status: DC
  Administered 2019-03-06 – 2019-03-09 (×11): 30 mg via INTRAVENOUS
  Filled 2019-03-06 (×11): qty 1

## 2019-03-06 MED ORDER — GABAPENTIN 600 MG PO TABS
300.0000 mg | ORAL_TABLET | Freq: Three times a day (TID) | ORAL | Status: DC
  Administered 2019-03-06 – 2019-03-14 (×24): 300 mg via ORAL
  Filled 2019-03-06 (×24): qty 1

## 2019-03-06 MED ORDER — HYDRALAZINE HCL 20 MG/ML IJ SOLN: 10.0000 mg | INTRAMUSCULAR | Status: DC | PRN

## 2019-03-06 MED ORDER — ONDANSETRON HCL 4 MG/2ML IJ SOLN
INTRAMUSCULAR | Status: AC
  Filled 2019-03-06: qty 2

## 2019-03-06 MED ORDER — ACETAMINOPHEN 500 MG PO TABS
1000.0000 mg | ORAL_TABLET | Freq: Four times a day (QID) | ORAL | Status: DC
  Administered 2019-03-06 – 2019-03-08 (×9): 1000 mg via ORAL
  Filled 2019-03-06 (×11): qty 2

## 2019-03-06 MED ORDER — CHLORHEXIDINE GLUCONATE CLOTH 2 % EX PADS: 6.0000 | MEDICATED_PAD | Freq: Once | CUTANEOUS | Status: DC

## 2019-03-06 MED ORDER — PROPOFOL 10 MG/ML IV BOLUS
INTRAVENOUS | Status: AC
  Filled 2019-03-06: qty 20

## 2019-03-06 MED ORDER — ROCURONIUM BROMIDE 50 MG/5ML IV SOLN
INTRAVENOUS | Status: AC
  Filled 2019-03-06: qty 1

## 2019-03-06 MED ORDER — DEXAMETHASONE SODIUM PHOSPHATE 10 MG/ML IJ SOLN
INTRAMUSCULAR | Status: DC | PRN
  Administered 2019-03-06: 10 mg via INTRAVENOUS

## 2019-03-06 MED ORDER — MORPHINE SULFATE (PF) 4 MG/ML IV SOLN
4.0000 mg | INTRAVENOUS | Status: DC | PRN
  Administered 2019-03-06 – 2019-03-09 (×5): 4 mg via INTRAVENOUS
  Filled 2019-03-06 (×5): qty 1

## 2019-03-06 MED ORDER — ONDANSETRON 4 MG PO TBDP
4.0000 mg | ORAL_TABLET | Freq: Four times a day (QID) | ORAL | Status: DC | PRN
  Administered 2019-03-12 (×2): 4 mg via ORAL
  Filled 2019-03-06 (×3): qty 1

## 2019-03-06 MED ORDER — GABAPENTIN 300 MG PO CAPS
ORAL_CAPSULE | ORAL | Status: AC
  Administered 2019-03-06: 300 mg via ORAL
  Filled 2019-03-06: qty 1

## 2019-03-06 MED ORDER — ONDANSETRON HCL 4 MG/2ML IJ SOLN
4.0000 mg | Freq: Four times a day (QID) | INTRAMUSCULAR | Status: DC | PRN
  Administered 2019-03-10 – 2019-03-12 (×4): 4 mg via INTRAVENOUS
  Filled 2019-03-06 (×4): qty 2

## 2019-03-06 MED ORDER — SUGAMMADEX SODIUM 200 MG/2ML IV SOLN
INTRAVENOUS | Status: DC | PRN
  Administered 2019-03-06: 182.4 mg via INTRAVENOUS

## 2019-03-06 MED ORDER — BUPIVACAINE LIPOSOME 1.3 % IJ SUSP
INTRAMUSCULAR | Status: AC
  Filled 2019-03-06: qty 20

## 2019-03-06 MED ORDER — ENOXAPARIN SODIUM 40 MG/0.4ML ~~LOC~~ SOLN
40.0000 mg | SUBCUTANEOUS | Status: DC
  Administered 2019-03-07 – 2019-03-09 (×3): 40 mg via SUBCUTANEOUS
  Filled 2019-03-06 (×3): qty 0.4

## 2019-03-06 MED ORDER — DEXAMETHASONE SODIUM PHOSPHATE 10 MG/ML IJ SOLN
INTRAMUSCULAR | Status: AC
  Filled 2019-03-06: qty 1

## 2019-03-06 MED ORDER — PROCHLORPERAZINE EDISYLATE 10 MG/2ML IJ SOLN
5.0000 mg | Freq: Four times a day (QID) | INTRAMUSCULAR | Status: DC | PRN
  Administered 2019-03-11 – 2019-03-12 (×3): 10 mg via INTRAVENOUS
  Filled 2019-03-06 (×5): qty 2

## 2019-03-06 MED ORDER — LIDOCAINE HCL (CARDIAC) PF 100 MG/5ML IV SOSY
PREFILLED_SYRINGE | INTRAVENOUS | Status: DC | PRN
  Administered 2019-03-06: 100 mg via INTRAVENOUS

## 2019-03-06 MED ORDER — FENTANYL CITRATE (PF) 100 MCG/2ML IJ SOLN
INTRAMUSCULAR | Status: AC
  Filled 2019-03-06: qty 2

## 2019-03-06 MED ORDER — MIDAZOLAM HCL 2 MG/2ML IJ SOLN
INTRAMUSCULAR | Status: DC | PRN
  Administered 2019-03-06: 2 mg via INTRAVENOUS

## 2019-03-06 MED ORDER — DIPHENHYDRAMINE HCL 12.5 MG/5ML PO ELIX
12.5000 mg | ORAL_SOLUTION | Freq: Four times a day (QID) | ORAL | Status: DC | PRN
  Filled 2019-03-06: qty 5

## 2019-03-06 MED ORDER — ROCURONIUM BROMIDE 50 MG/5ML IV SOLN
INTRAVENOUS | Status: AC
  Filled 2019-03-06: qty 2

## 2019-03-06 MED ORDER — ACETAMINOPHEN 500 MG PO TABS
1000.0000 mg | ORAL_TABLET | ORAL | Status: AC
  Administered 2019-03-06: 08:00:00 1000 mg via ORAL

## 2019-03-06 MED ORDER — PANTOPRAZOLE SODIUM 40 MG IV SOLR
40.0000 mg | Freq: Two times a day (BID) | INTRAVENOUS | Status: DC
  Administered 2019-03-06 – 2019-03-14 (×16): 40 mg via INTRAVENOUS
  Filled 2019-03-06 (×16): qty 40

## 2019-03-06 MED ORDER — PROMETHAZINE HCL 25 MG/ML IJ SOLN: 6.2500 mg | INTRAMUSCULAR | Status: DC | PRN

## 2019-03-06 MED ORDER — SODIUM CHLORIDE 0.9 % IV SOLN
INTRAVENOUS | Status: DC | PRN
  Administered 2019-03-06: 13:00:00 70 mL

## 2019-03-06 MED ORDER — HYDROMORPHONE HCL 1 MG/ML IJ SOLN
INTRAMUSCULAR | Status: AC
  Filled 2019-03-06: qty 1

## 2019-03-06 MED ORDER — LACTATED RINGERS IV SOLN
INTRAVENOUS | Status: DC
  Administered 2019-03-06 (×3): via INTRAVENOUS

## 2019-03-06 MED ORDER — TRAMADOL HCL 50 MG PO TABS
50.0000 mg | ORAL_TABLET | Freq: Four times a day (QID) | ORAL | Status: DC | PRN
  Administered 2019-03-07 – 2019-03-08 (×2): 50 mg via ORAL
  Filled 2019-03-06 (×2): qty 1

## 2019-03-06 MED ORDER — ONDANSETRON HCL 4 MG/2ML IJ SOLN
INTRAMUSCULAR | Status: DC | PRN
  Administered 2019-03-06: 4 mg via INTRAVENOUS

## 2019-03-06 MED ORDER — PROPOFOL 10 MG/ML IV BOLUS
INTRAVENOUS | Status: DC | PRN
  Administered 2019-03-06: 180 mg via INTRAVENOUS

## 2019-03-06 MED ORDER — FENTANYL CITRATE (PF) 250 MCG/5ML IJ SOLN
INTRAMUSCULAR | Status: AC
  Filled 2019-03-06: qty 5

## 2019-03-06 MED ORDER — FENTANYL CITRATE (PF) 100 MCG/2ML IJ SOLN
INTRAMUSCULAR | Status: AC
  Administered 2019-03-06: 25 ug via INTRAVENOUS
  Filled 2019-03-06: qty 2

## 2019-03-06 MED ORDER — LACTATED RINGERS IV SOLN
INTRAVENOUS | Status: DC
  Administered 2019-03-06 – 2019-03-07 (×4): via INTRAVENOUS

## 2019-03-06 MED ORDER — SUCCINYLCHOLINE CHLORIDE 20 MG/ML IJ SOLN
INTRAMUSCULAR | Status: DC | PRN
  Administered 2019-03-06: 100 mg via INTRAVENOUS

## 2019-03-06 MED ORDER — MIDAZOLAM HCL 2 MG/2ML IJ SOLN
INTRAMUSCULAR | Status: AC
  Filled 2019-03-06: qty 2

## 2019-03-06 MED ORDER — ACETAMINOPHEN 500 MG PO TABS
ORAL_TABLET | ORAL | Status: AC
  Administered 2019-03-06: 1000 mg via ORAL
  Filled 2019-03-06: qty 2

## 2019-03-06 MED ORDER — ROCURONIUM BROMIDE 100 MG/10ML IV SOLN
INTRAVENOUS | Status: DC | PRN
  Administered 2019-03-06: 20 mg via INTRAVENOUS
  Administered 2019-03-06: 10 mg via INTRAVENOUS
  Administered 2019-03-06: 30 mg via INTRAVENOUS
  Administered 2019-03-06: 5 mg via INTRAVENOUS
  Administered 2019-03-06: 20 mg via INTRAVENOUS
  Administered 2019-03-06: 45 mg via INTRAVENOUS
  Administered 2019-03-06 (×3): 20 mg via INTRAVENOUS
  Administered 2019-03-06: 10 mg via INTRAVENOUS
  Administered 2019-03-06: 20 mg via INTRAVENOUS

## 2019-03-06 MED ORDER — DEXMEDETOMIDINE HCL 200 MCG/2ML IV SOLN
INTRAVENOUS | Status: DC | PRN
  Administered 2019-03-06: 8 ug via INTRAVENOUS
  Administered 2019-03-06 (×2): 4 ug via INTRAVENOUS
  Administered 2019-03-06: 12 ug via INTRAVENOUS
  Administered 2019-03-06: 4 ug via INTRAVENOUS

## 2019-03-06 MED ORDER — PROCHLORPERAZINE MALEATE 10 MG PO TABS
10.0000 mg | ORAL_TABLET | Freq: Four times a day (QID) | ORAL | Status: DC | PRN
  Filled 2019-03-06: qty 1

## 2019-03-06 MED ORDER — HYDROMORPHONE HCL 1 MG/ML IJ SOLN
INTRAMUSCULAR | Status: DC | PRN
  Administered 2019-03-06 (×2): 1 mg via INTRAVENOUS

## 2019-03-06 MED ORDER — FENTANYL CITRATE (PF) 100 MCG/2ML IJ SOLN
INTRAMUSCULAR | Status: DC | PRN
  Administered 2019-03-06 (×3): 100 ug via INTRAVENOUS
  Administered 2019-03-06 (×2): 50 ug via INTRAVENOUS
  Administered 2019-03-06: 100 ug via INTRAVENOUS

## 2019-03-06 MED ORDER — KETOROLAC TROMETHAMINE 30 MG/ML IJ SOLN
INTRAMUSCULAR | Status: DC | PRN
  Administered 2019-03-06: 30 mg via INTRAVENOUS

## 2019-03-06 MED ORDER — DIPHENHYDRAMINE HCL 50 MG/ML IJ SOLN
12.5000 mg | Freq: Four times a day (QID) | INTRAMUSCULAR | Status: DC | PRN
  Administered 2019-03-14: 09:00:00 12.5 mg via INTRAVENOUS
  Filled 2019-03-06: qty 1

## 2019-03-06 MED ORDER — FENTANYL CITRATE (PF) 100 MCG/2ML IJ SOLN
25.0000 ug | INTRAMUSCULAR | Status: DC | PRN
  Administered 2019-03-06 (×4): 25 ug via INTRAVENOUS

## 2019-03-06 MED ORDER — CELECOXIB 200 MG PO CAPS
ORAL_CAPSULE | ORAL | Status: AC
  Administered 2019-03-06: 200 mg via ORAL
  Filled 2019-03-06: qty 1

## 2019-03-06 MED ORDER — KETOROLAC TROMETHAMINE 30 MG/ML IJ SOLN: 30.0000 mg | Freq: Four times a day (QID) | INTRAMUSCULAR | Status: DC | PRN

## 2019-03-06 MED ORDER — CELECOXIB 200 MG PO CAPS
200.0000 mg | ORAL_CAPSULE | ORAL | Status: AC
  Administered 2019-03-06: 08:00:00 200 mg via ORAL

## 2019-03-06 MED ORDER — SUGAMMADEX SODIUM 200 MG/2ML IV SOLN
INTRAVENOUS | Status: AC
  Filled 2019-03-06: qty 2

## 2019-03-06 MED ORDER — LIDOCAINE HCL (PF) 2 % IJ SOLN
INTRAMUSCULAR | Status: AC
  Filled 2019-03-06: qty 10

## 2019-03-06 MED ORDER — SUCCINYLCHOLINE CHLORIDE 20 MG/ML IJ SOLN
INTRAMUSCULAR | Status: AC
  Filled 2019-03-06: qty 1

## 2019-03-06 MED ORDER — BUPIVACAINE-EPINEPHRINE 0.25% -1:200000 IJ SOLN
INTRAMUSCULAR | Status: DC | PRN
  Administered 2019-03-06: 30 mL

## 2019-03-06 MED ORDER — OXYCODONE HCL 5 MG PO TABS
5.0000 mg | ORAL_TABLET | ORAL | Status: DC | PRN
  Administered 2019-03-07 (×3): 10 mg via ORAL
  Administered 2019-03-08 – 2019-03-09 (×2): 5 mg via ORAL
  Filled 2019-03-06 (×4): qty 2
  Filled 2019-03-06: qty 1

## 2019-03-06 SURGICAL SUPPLY — 64 items
APPLIER CLIP 11 MED OPEN (CLIP)
APPLIER CLIP 13 LRG OPEN (CLIP)
BARRIER ADH SEPRAFILM 3INX5IN (MISCELLANEOUS) ×4 IMPLANT
BLADE CLIPPER SURG (BLADE) ×4 IMPLANT
BNDG CONFORM 2 STRL LF (GAUZE/BANDAGES/DRESSINGS) ×4 IMPLANT
BOOT SUTURE AID YELLOW STND (SUTURE) ×2 IMPLANT
BRUSH SCRUB EZ  4% CHG (MISCELLANEOUS)
BRUSH SCRUB EZ 4% CHG (MISCELLANEOUS) ×2 IMPLANT
BULB RESERV EVAC DRAIN JP 100C (MISCELLANEOUS) ×4 IMPLANT
CANISTER SUCT 3000ML PPV (MISCELLANEOUS) ×4 IMPLANT
CATH URET ROBINSON 16FR STRL (CATHETERS) ×4 IMPLANT
CHLORAPREP W/TINT 26 (MISCELLANEOUS) ×2 IMPLANT
CLIP APPLIE 11 MED OPEN (CLIP) IMPLANT
CLIP APPLIE 13 LRG OPEN (CLIP) IMPLANT
COVER WAND RF STERILE (DRAPES) ×4 IMPLANT
DRAIN CHANNEL JP 19F (MISCELLANEOUS) ×4 IMPLANT
DRAPE LAPAROTOMY 100X77 ABD (DRAPES) ×4 IMPLANT
DRSG OPSITE POSTOP 4X6 (GAUZE/BANDAGES/DRESSINGS) ×2 IMPLANT
DRSG TEGADERM 4X4.75 (GAUZE/BANDAGES/DRESSINGS) ×2 IMPLANT
DRSG TELFA 3X8 NADH (GAUZE/BANDAGES/DRESSINGS) IMPLANT
DRSG TELFA 4X3 1S NADH ST (GAUZE/BANDAGES/DRESSINGS) ×2 IMPLANT
ELECT BLADE 6.5 EXT (BLADE) ×4 IMPLANT
ELECT CAUTERY BLADE 6.4 (BLADE) ×4 IMPLANT
ELECT REM PT RETURN 9FT ADLT (ELECTROSURGICAL) ×4
ELECTRODE REM PT RTRN 9FT ADLT (ELECTROSURGICAL) ×2 IMPLANT
GAUZE 4X4 16PLY RFD (DISPOSABLE) ×2 IMPLANT
GAUZE SPONGE 4X4 12PLY STRL (GAUZE/BANDAGES/DRESSINGS) ×2 IMPLANT
GLOVE BIO SURGEON STRL SZ7 (GLOVE) ×12 IMPLANT
GOWN STRL REUS W/ TWL LRG LVL3 (GOWN DISPOSABLE) ×8 IMPLANT
GOWN STRL REUS W/TWL LRG LVL3 (GOWN DISPOSABLE) ×12
HANDLE SUCTION POOLE (INSTRUMENTS) ×2 IMPLANT
KIT TURNOVER KIT A (KITS) ×4 IMPLANT
LIGASURE IMPACT 36 18CM CVD LR (INSTRUMENTS) ×2 IMPLANT
MESH PHASIX ST 30X35 (Mesh General) ×2 IMPLANT
NEEDLE HYPO 22GX1.5 SAFETY (NEEDLE) ×4 IMPLANT
NS IRRIG 1000ML POUR BTL (IV SOLUTION) ×4 IMPLANT
PACK BASIN MAJOR ARMC (MISCELLANEOUS) ×4 IMPLANT
PACK COLON CLEAN CLOSURE (MISCELLANEOUS) ×2 IMPLANT
PAD DRESSING TELFA 3X8 NADH (GAUZE/BANDAGES/DRESSINGS) ×2 IMPLANT
PLEDGET CV PTFE 7X3 (MISCELLANEOUS) IMPLANT
PREVENA INCISION MGT 90 150 (MISCELLANEOUS) ×4 IMPLANT
RELOAD PROXIMATE 75MM BLUE (ENDOMECHANICALS) ×8 IMPLANT
RELOAD STAPLE 75 3.8 BLU REG (ENDOMECHANICALS) IMPLANT
REMOVER LOTION OMD 4OZ (MISCELLANEOUS) ×2 IMPLANT
SPONGE KITTNER 5P (MISCELLANEOUS) ×4 IMPLANT
SPONGE LAP 18X18 RF (DISPOSABLE) ×10 IMPLANT
SPONGE LAP 18X36 RFD (DISPOSABLE) ×2 IMPLANT
STAPLER PROXIMATE 75MM BLUE (STAPLE) ×2 IMPLANT
STAPLER SKIN PROX 35W (STAPLE) ×2 IMPLANT
SUCTION POOLE HANDLE (INSTRUMENTS) ×4
SUT ETHILON 3-0 FS-10 30 BLK (SUTURE) ×4
SUT PDS AB 0 CT1 27 (SUTURE) ×20 IMPLANT
SUT PDS II 3-0 (SUTURE) ×8 IMPLANT
SUT PDS PLUS 0 (SUTURE) ×8
SUT PDS PLUS AB 0 CT-2 (SUTURE) ×8 IMPLANT
SUT SILK 2 0 (SUTURE) ×2
SUT SILK 2 0 SH CR/8 (SUTURE) ×4 IMPLANT
SUT SILK 2-0 18XBRD TIE 12 (SUTURE) ×2 IMPLANT
SUTURE EHLN 3-0 FS-10 30 BLK (SUTURE) IMPLANT
SYR 20ML LL LF (SYRINGE) ×8 IMPLANT
SYR BULB IRRIG 60ML STRL (SYRINGE) ×2 IMPLANT
SYRINGE IRR TOOMEY STRL 70CC (SYRINGE) ×4 IMPLANT
TRAY FOLEY MTR SLVR 16FR STAT (SET/KITS/TRAYS/PACK) ×4 IMPLANT
TRAY FOLEY SLVR 16FR LF STAT (SET/KITS/TRAYS/PACK) ×2 IMPLANT

## 2019-03-06 NOTE — Anesthesia Postprocedure Evaluation (Signed)
Anesthesia Post Note  Patient: [name]  Procedure(s) Performed: COLOSTOMY TAKEDOWN (N/A Abdomen) REPAIR ABDOMINAL WALL (N/A Abdomen)  Patient location during evaluation: PACU Anesthesia Type: General Level of consciousness: awake and alert Pain management: pain level controlled Vital Signs Assessment: post-procedure vital signs reviewed and stable Respiratory status: spontaneous breathing, nonlabored ventilation and respiratory function stable Cardiovascular status: blood pressure returned to baseline and stable Postop Assessment: no apparent nausea or vomiting Anesthetic complications: no     Last Vitals:  Vitals:   03/06/19 0721 03/06/19 1347  BP: (!) 142/90 126/79  Pulse: (!) 52 99  Resp: 14 17  Temp: 36.6 C 37.1 C  SpO2: 100% 100%    Last Pain:  Vitals:   03/06/19 1347  TempSrc:   PainSc: Asleep                 Alphonsus Sias

## 2019-03-06 NOTE — Transfer of Care (Signed)
Immediate Anesthesia Transfer of Care Note  Patient: [name]  Procedure(s) Performed: COLOSTOMY TAKEDOWN (N/A Abdomen) REPAIR ABDOMINAL WALL (N/A Abdomen)  Patient Location: PACU  Anesthesia Type:General  Level of Consciousness: drowsy  Airway & Oxygen Therapy: Patient Spontanous Breathing and Patient connected to face mask oxygen  Post-op Assessment: Report given to RN and Post -op Vital signs reviewed and stable  Post vital signs: Reviewed and stable  Last Vitals:  Vitals Value Taken Time  BP 126/79 03/06/19 1347  Temp    Pulse 98 03/06/19 1347  Resp 20 03/06/19 1347  SpO2 100 % 03/06/19 1347  Vitals shown include unvalidated device data.  Last Pain:  Vitals:   03/06/19 0721  TempSrc: Tympanic  PainSc: 3          Complications: No apparent anesthesia complications
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
## 2019-03-06 NOTE — Op Note (Signed)
PROCEDURES: 1. Abdominal wall reconstruction with bilateral myocutaneous flaps using a TAR release on the Left and Rives-Stoppa release on the Right.   30 x 30 cm  Phasix ST Mesh used. 2. Incisional hernia repair 3. Appendectomy 4. Colostomy takedown 5. 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 with endotracheal tube  Assistant: Dr.Oaks ( essential due to the complexity of the procedure and to have adequate exposure)  Findings: Large ventral hernia with parastomal hernia with loss of domain Great coverage of defect with reconstruction of  his abd wall  No evidence of anastomotic leak Tension free anastomosis with good perfusion  Estimated Blood Loss: 50cc         Drains: 19 FR x 2               Complications: none        Condition: stable  Procedure Details  The patient was seen again in the Holding Room. The benefits, complications, treatment options, and expected outcomes were discussed with the patient. The risks of bleeding, infection, recurrence of symptoms, failure to resolve symptoms,  bowel injury, any of which could require further surgery were reviewed with the patient.   The patient was taken to Operating Room, identified as Guido Sander and the procedure verified.  A Time Out was held and the above information confirmed.  Generous elliptical incision was created from pubis to xiphoid incorporating the old scar.  The subcutaneous tissue is divided and the fascia and the hernia sac are identified. The peritoneal cavity was entered.  Extensive adhesions were performed using a combination of electrocautery and Metzenbaum scissors.  There was significant adhesions of the small bowel to the abdominal wall and the  omentum to the abdominal wall.   We identified the rectal stump and the descending colon. There was no evidence of disease and there was plenty of length. I created an end to side anastomosis handsewn using 3-0 PDS.  We first part treated the anterior and left portion of the anastomosis on perform the anastomosis in a continuous fashion.  There was excellent coverage and no evidence of anastomotic leak.  We also place rubber catheter in the rectum and inflated the rectum.  There was no evidence of bubbles to suggest anastomotic leak.   I Decided to perform an appendectomy given the extensive surgery.  The mesoappendix was divided between clamps and suture ligated and the appendix was divided with a standard 55 GIA stapler.   Once we had good lysis of adhesions we entered the retrorectus space by identifying/ incising the posterior sheath. This dissection is then performed laterally to the linea semilunaris, the most lateral aspect of the retrorectus space, using a combination of both blunt and sharp dissection. On thje left side we performed a TAR release after  The parastomal hernia was reduced. I also approximated the peritoneum.   The retromuscular space is then bluntly developed to as far as the lateral border of the psoas muscle.   The dissection is repeated on the opposite  But only performing release of the rectus abdominus muscle to create a Rives-Stoppa repair on the Right side.  Dissection Was carried superiorly to the central tendon of the diaphragm, using a subxiphoid and retrosternal  dissection.  There was a small pericardiotomy that we closed w  A figure of eight 3.-0 vicryl. Dissection also  was developed inferiorly to the retropubic space.   Next, the posterior rectus sheaths are approximated to one another with 0 PDS suture.  Using liposomal Marcaine TAP Block was performed on each side under direct visualization.   I Selected a 30 x 30 cm Phasix mesh and in a diamond  configuration place it anterior to the peritoneum.  Mesh was Fix it to both the pubic and the side foot with interrupted 0 PDS sutures.  The mesh lay very flat on in a corset fashion.  I was very happy with the repair.  A 19 [place] Blake drain was placed in the retrorectus space in the standard fashion.  This retrorectus drain was placed on the right inferior abdominal wall.  Attention Was turned to the anterior sheath.  This was approximated with multiple running 0 PDS sutures.  We made sure that we released some of the tension that was attached to the subcutaneous tissue. And the significant that space I decided to also place a 19 Blake drain within the subcutaneous tissue.  The exit of this drain was to the left upper quadrant.  The wound was irrigated with saline and sterile water.  It was approximated with a staples and a Prevena plus wound VAC was placed in the standard fashion.  Needle and laparotomy count were correct and there were no immediate complications  Caroleen Hamman, MD, FACS

## 2019-03-06 NOTE — Anesthesia Procedure Notes (Signed)
Procedure Name: Intubation Performed by: Demetrius Charity, CRNA Pre-anesthesia Checklist: Patient identified, Patient being monitored, Timeout performed, Emergency Drugs available and Suction available Patient Re-evaluated:Patient Re-evaluated prior to induction Oxygen Delivery Method: Circle system utilized Preoxygenation: Pre-oxygenation with 100% oxygen Induction Type: IV induction Ventilation: Mask ventilation without difficulty Laryngoscope Size: McGraph and 4 Grade View: Grade II Tube type: Oral Tube size: 7.5 mm Number of attempts: 1 Airway Equipment and Method: Stylet and Video-laryngoscopy Placement Confirmation: ETT inserted through vocal cords under direct vision,  positive ETCO2 and breath sounds checked- equal and bilateral Secured at: 23 cm Tube secured with: Tape Dental Injury: Teeth and Oropharynx as per pre-operative assessment

## 2019-03-06 NOTE — Interval H&P Note (Signed)
History and Physical Interval Note:  03/06/2019 8:34 AM  [name]  has presented today for surgery, with the diagnosis of Parastomal Hernia Colostomy.  The various methods of treatment have been discussed with the patient and family. After consideration of risks, benefits and other options for treatment, the patient has consented to  Procedure(s): COLOSTOMY TAKEDOWN (N/A) REPAIR ABDOMINAL WALL (N/A) as a surgical intervention.  The patient's history has been reviewed, patient examined, no change in status, stable for surgery.  I have reviewed the patient's chart and labs.  Questions were answered to the patient's satisfaction.     Todd

## 2019-03-06 NOTE — Anesthesia Post-op Follow-up Note (Signed)
Anesthesia QCDR form completed.        

## 2019-03-07 ENCOUNTER — Encounter: Payer: Self-pay | Admitting: Surgery

## 2019-03-07 LAB — COMPREHENSIVE METABOLIC PANEL
ALT: 14 U/L (ref 0–44)
AST: 50 U/L — ABNORMAL HIGH (ref 15–41)
Albumin: 3.6 g/dL (ref 3.5–5.0)
Alkaline Phosphatase: 46 U/L (ref 38–126)
Anion gap: 6 (ref 5–15)
BUN: 10 mg/dL (ref 6–20)
CO2: 26 mmol/L (ref 22–32)
Calcium: 8.6 mg/dL — ABNORMAL LOW (ref 8.9–10.3)
Chloride: 102 mmol/L (ref 98–111)
Creatinine, Ser: 0.95 mg/dL (ref 0.61–1.24)
GFR calc Af Amer: >60 mL/min (ref >60)
GFR calc non Af Amer: >60 mL/min (ref >60)
Glucose, Bld: 123 mg/dL — ABNORMAL HIGH (ref 70–99)
Potassium: 3.9 mmol/L (ref 3.5–5.1)
Sodium: 134 mmol/L — ABNORMAL LOW (ref 135–145)
Total Bilirubin: 0.7 mg/dL (ref 0.3–1.2)
Total Protein: 6.2 g/dL — ABNORMAL LOW (ref 6.5–8.1)

## 2019-03-07 LAB — CBC
HCT: 44.9 % (ref 39.0–52.0)
Hemoglobin: 14.1 g/dL (ref 13.0–17.0)
MCH: 25.9 pg — ABNORMAL LOW (ref 26.0–34.0)
MCHC: 31.4 g/dL (ref 30.0–36.0)
MCV: 82.4 fL (ref 80.0–100.0)
Platelets: 263 10*3/uL (ref 150–400)
RBC: 5.45 MIL/uL (ref 4.22–5.81)
RDW: 13.5 % (ref 11.5–15.5)
WBC: 15.1 10*3/uL — ABNORMAL HIGH (ref 4.0–10.5)
nRBC: 0 % (ref 0.0–0.2)

## 2019-03-07 LAB — PHOSPHORUS: Phosphorus: 3.6 mg/dL (ref 2.5–4.6)

## 2019-03-07 LAB — MAGNESIUM: Magnesium: 1.9 mg/dL (ref 1.7–2.4)

## 2019-03-07 MED ORDER — CYCLOBENZAPRINE HCL 10 MG PO TABS
5.0000 mg | ORAL_TABLET | Freq: Three times a day (TID) | ORAL | Status: DC
  Administered 2019-03-07 – 2019-03-14 (×23): 5 mg via ORAL
  Filled 2019-03-07 (×23): qty 1

## 2019-03-07 MED ORDER — FLUOXETINE HCL 10 MG PO CAPS
10.0000 mg | ORAL_CAPSULE | Freq: Two times a day (BID) | ORAL | Status: DC
  Administered 2019-03-07 – 2019-03-14 (×15): 10 mg via ORAL
  Filled 2019-03-07 (×16): qty 1

## 2019-03-07 NOTE — Progress Notes (Signed)
[facility] Day(s): 1.   Post op day(s): 1 Day Post-Op.   Interval History: Patient seen and examined, no acute events or new complaints overnight. Patient reports that he has abdominal soreness but it is well controlled. No fever, chills, nausea, or emesis. He has tolerated clear liquids without issues. No flatus. Labs are reassuring this morning. Using IS. No acute concerns.   Vital signs in last 24 hours: [min-max] current  Temp:  [97.4 F (36.3 C)-98.8 F (37.1 C)] 98.1 F (36.7 C) (08/21 0533) Pulse Rate:  [62-99] 76 (08/21 0533) Resp:  [8-20] 20 (08/21 0533) BP: (122-142)/(74-96) 128/88 (08/21 0533) SpO2:  [87 %-100 %] 99 % (08/21 0533) Weight:  [97.5 kg] 97.5 kg (08/20 1856)     Height: 5\' 7"  (170.2 cm) Weight: 97.5 kg BMI (Calculated): 33.65   Intake/Output last 2 shifts:  08/20 0701 - 08/21 0700 In: 2193 [P.O.:118; I.V.:2000] Out: 3585 [Urine:3325; Drains:160; Blood:100]   Physical Exam:  Constitutional: alert, cooperative and no distress  Respiratory: breathing non-labored at rest  Cardiovascular: regular rate and sinus rhythm  Gastrointestinal: soft, incisional soreness, and non-distended. JP drains with serosanguinous output Integumentary: Prevena wound vac in place, good seal  Labs:  CBC Latest Ref Rng & Units 03/07/2019 03/06/2019 01/22/2019  WBC 4.0 - 10.5 K/uL 15.1(H) 21.0(H) 8.9  Hemoglobin 13.0 - 17.0 g/dL 14.1 14.7 14.4  Hematocrit 39.0 - 52.0 % 44.9 46.6 45.5  Platelets 150 - 400 K/uL 263 297 296   CMP Latest Ref Rng & Units 03/07/2019 03/06/2019 01/22/2019  Glucose 70 - 99 mg/dL 123(H) - 98  BUN 6 - 20 mg/dL 10 - 17  Creatinine 0.61 - 1.24 mg/dL 0.95 0.95 0.93  Sodium 135 - 145 mmol/L 134(L) - 138  Potassium 3.5 - 5.1 mmol/L 3.9 - 4.4  Chloride 98 - 111 mmol/L 102 - 107  CO2 22 - 32 mmol/L 26 - 25  Calcium 8.9 - 10.3 mg/dL 8.6(L) - 9.1  Total Protein 6.5 - 8.1 g/dL 6.2(L) - 7.3  Total Bilirubin 0.3 - 1.2  mg/dL 0.7 - 0.6  Alkaline Phos 38 - 126 U/L 46 - 61  AST 15 - 41 U/L 50(H) - 17  ALT 0 - 44 U/L 14 - 12    Imaging studies: No new pertinent imaging studies   Assessment/Plan:  48 y.o. male overall doing well still awaiting significant return of bowel function 1 Day Post-Op s/p colostomy takedown and abdominal wall reconstruction for symptomatic parastomal hernia, complicated by pertinent comorbidities including anxiety/depression.   - Continue CLD + wean IVF  - pain control prn; added flexeril; antiemetics prn  - monitor abdominal examination; on-going bowel function  - Monitor JP and wound vac output  - discontinue foley catheter this morning  - mobilization encouraged  - medical management of comorbidities; restarted home medications   All of the above findings and recommendations were discussed with the patient, and the medical team, and all of patient's questions were answered to his expressed satisfaction.  -- Edison Simon, PA-C [facility] 03/07/2019, 8:12 AM [phone] M-F: 7am - 4pm

## 2019-03-08 LAB — BASIC METABOLIC PANEL
Anion gap: 8 (ref 5–15)
BUN: 8 mg/dL (ref 6–20)
CO2: 27 mmol/L (ref 22–32)
Calcium: 8.7 mg/dL — ABNORMAL LOW (ref 8.9–10.3)
Chloride: 101 mmol/L (ref 98–111)
Creatinine, Ser: 0.95 mg/dL (ref 0.61–1.24)
GFR calc Af Amer: >60 mL/min (ref >60)
GFR calc non Af Amer: >60 mL/min (ref >60)
Glucose, Bld: 103 mg/dL — ABNORMAL HIGH (ref 70–99)
Potassium: 4.4 mmol/L (ref 3.5–5.1)
Sodium: 136 mmol/L (ref 135–145)

## 2019-03-08 LAB — CBC
HCT: 42.5 % (ref 39.0–52.0)
Hemoglobin: 13.4 g/dL (ref 13.0–17.0)
MCH: 25.9 pg — ABNORMAL LOW (ref 26.0–34.0)
MCHC: 31.5 g/dL (ref 30.0–36.0)
MCV: 82.2 fL (ref 80.0–100.0)
Platelets: 231 10*3/uL (ref 150–400)
RBC: 5.17 MIL/uL (ref 4.22–5.81)
RDW: 13.4 % (ref 11.5–15.5)
WBC: 15.6 10*3/uL — ABNORMAL HIGH (ref 4.0–10.5)
nRBC: 0 % (ref 0.0–0.2)

## 2019-03-08 LAB — HIV ANTIBODY (ROUTINE TESTING W REFLEX): HIV Screen 4th Generation wRfx: NONREACTIVE

## 2019-03-08 MED ORDER — ZOLPIDEM TARTRATE 5 MG PO TABS
5.0000 mg | ORAL_TABLET | Freq: Every evening | ORAL | Status: DC | PRN
  Administered 2019-03-08 – 2019-03-12 (×6): 5 mg via ORAL
  Filled 2019-03-08 (×6): qty 1

## 2019-03-08 NOTE — Progress Notes (Signed)
Patients wife comes straight from work. 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

## 2019-03-08 NOTE — Progress Notes (Signed)
Dr Lysle Pearl notified pt having difficulty sleeping, stated he would put in orders

## 2019-03-08 NOTE — Progress Notes (Signed)
Subjective:  CC: [name] is a 48 y.o. male  Hospital stay day 2, 2 Days Post-Op s/p colostomy takedown and abdominal wall reconstruction for symptomatic parastomal hernia, complicated by pertinent comorbidities including anxiety/depression.  HPI: No acute issues overnight.  Denies any flatus or bowel movements but feeling okay.  Tolerated clears  ROS:  General: Denies weight loss, weight gain, fatigue, fevers, chills, and night sweats. Heart: Denies chest pain, palpitations, racing heart, irregular heartbeat, leg pain or swelling, and decreased activity tolerance. Respiratory: Denies breathing difficulty, shortness of breath, wheezing, cough, and sputum. GI: Denies change in appetite, heartburn, nausea, vomiting, constipation, diarrhea, and blood in stool. GU: Denies difficulty urinating, pain with urinating, urgency, frequency, blood in urine.   Objective:   Temp:  [98.3 F (36.8 C)-98.8 F (37.1 C)] 98.7 F (37.1 C) (08/22 1237) Pulse Rate:  [75-86] 82 (08/22 1237) Resp:  [18] 18 (08/22 0444) BP: (121-127)/(83-86) 121/85 (08/22 1237) SpO2:  [98 %-99 %] 99 % (08/22 1237)     Height: 5\' 7"  (170.2 cm) Weight: 97.5 kg BMI (Calculated): 33.65   Intake/Output this shift:   Intake/Output Summary (Last 24 hours) at 03/08/2019 1748 Last data filed at 03/08/2019 1500 Gross per 24 hour  Intake -  Output 2020 ml  Net -2020 ml  JP with slightly more sanguinous discharge.  Constitutional :  alert, cooperative, appears stated age and no distress  Respiratory:  clear to auscultation bilaterally  Cardiovascular:  regular rate and rhythm, S1, S2 normal, no murmur, click, rub or gallop and regular rate and rhythm  Gastrointestinal: soft, non-tender; bowel sounds normal; no masses,  no organomegaly. Slight distention with hypertympany on exam  Skin: Cool and moist.  Midline incision intact with wound VAC atop it.  JP with sanguinous drainage.    Psychiatric: Normal affect, non-agitated, not  confused       LABS:  CMP Latest Ref Rng & Units 03/08/2019 03/07/2019 03/06/2019  Glucose 70 - 99 mg/dL 103(H) 123(H) -  BUN 6 - 20 mg/dL 8 10 -  Creatinine 0.61 - 1.24 mg/dL 0.95 0.95 0.95  Sodium 135 - 145 mmol/L 136 134(L) -  Potassium 3.5 - 5.1 mmol/L 4.4 3.9 -  Chloride 98 - 111 mmol/L 101 102 -  CO2 22 - 32 mmol/L 27 26 -  Calcium 8.9 - 10.3 mg/dL 8.7(L) 8.6(L) -  Total Protein 6.5 - 8.1 g/dL - 6.2(L) -  Total Bilirubin 0.3 - 1.2 mg/dL - 0.7 -  Alkaline Phos 38 - 126 U/L - 46 -  AST 15 - 41 U/L - 50(H) -  ALT 0 - 44 U/L - 14 -   CBC Latest Ref Rng & Units 03/08/2019 03/07/2019 03/06/2019  WBC 4.0 - 10.5 K/uL 15.6(H) 15.1(H) 21.0(H)  Hemoglobin 13.0 - 17.0 g/dL 13.4 14.1 14.7  Hematocrit 39.0 - 52.0 % 42.5 44.9 46.6  Platelets 150 - 400 K/uL 231 263 297    RADS: n/a Assessment:   s/p colostomy takedown and abdominal wall reconstruction for symptomatic parastomal hernia, complicated by pertinent comorbidities including anxiety/depression.  Doing well.  Advance to full liquids today.  We will need to continue to monitor his distention.  Although patient denies any complaints.  Also continue to monitor his JP output as well.

## 2019-03-09 MED ORDER — ACETAMINOPHEN 325 MG PO TABS
650.0000 mg | ORAL_TABLET | Freq: Four times a day (QID) | ORAL | Status: DC | PRN
  Administered 2019-03-11 – 2019-03-13 (×4): 650 mg via ORAL
  Filled 2019-03-09 (×4): qty 2

## 2019-03-09 MED ORDER — OXYCODONE HCL 5 MG PO TABS
5.0000 mg | ORAL_TABLET | ORAL | Status: DC | PRN
  Administered 2019-03-09: 5 mg via ORAL
  Administered 2019-03-09 – 2019-03-14 (×9): 10 mg via ORAL
  Filled 2019-03-09 (×4): qty 2
  Filled 2019-03-09: qty 1
  Filled 2019-03-09 (×7): qty 2

## 2019-03-09 MED ORDER — IBUPROFEN 400 MG PO TABS
800.0000 mg | ORAL_TABLET | Freq: Three times a day (TID) | ORAL | Status: DC | PRN
  Administered 2019-03-09 – 2019-03-13 (×6): 800 mg via ORAL
  Filled 2019-03-09 (×7): qty 2

## 2019-03-09 MED ORDER — MORPHINE SULFATE (PF) 4 MG/ML IV SOLN
4.0000 mg | INTRAVENOUS | Status: DC | PRN
  Administered 2019-03-10 – 2019-03-14 (×2): 4 mg via INTRAVENOUS
  Filled 2019-03-09 (×3): qty 1

## 2019-03-09 MED ORDER — DOCUSATE SODIUM 100 MG PO CAPS
100.0000 mg | ORAL_CAPSULE | Freq: Two times a day (BID) | ORAL | Status: DC | PRN
  Administered 2019-03-09 – 2019-03-11 (×3): 100 mg via ORAL
  Filled 2019-03-09 (×3): qty 1

## 2019-03-09 NOTE — Progress Notes (Signed)
Subjective:  CC: [name] is a 48 y.o. male  Hospital stay day 3, 3 Days Post-Op s/p colostomy takedown and abdominal wall reconstruction for symptomatic parastomal hernia, complicated by pertinent comorbidities including anxiety/depression.  HPI: No acute issues overnight.  Positive bowel movements overnight tolerating full liquid.  ROS:  General: Denies weight loss, weight gain, fatigue, fevers, chills, and night sweats. Heart: Denies chest pain, palpitations, racing heart, irregular heartbeat, leg pain or swelling, and decreased activity tolerance. Respiratory: Denies breathing difficulty, shortness of breath, wheezing, cough, and sputum. GI: Denies change in appetite, heartburn, nausea, vomiting, constipation, diarrhea, and blood in stool. GU: Denies difficulty urinating, pain with urinating, urgency, frequency, blood in urine.   Objective:   Temp:  [97.9 F (36.6 C)-98.7 F (37.1 C)] 97.9 F (36.6 C) (08/23 0430) Pulse Rate:  [73-82] 73 (08/23 0430) Resp:  [18] 18 (08/23 0430) BP: (121-123)/(80-91) 123/91 (08/23 0430) SpO2:  [99 %] 99 % (08/23 0430)     Height: 5\' 7"  (170.2 cm) Weight: 97.5 kg BMI (Calculated): 33.65   Intake/Output this shift:   Intake/Output Summary (Last 24 hours) at 03/09/2019 1024 Last data filed at 03/09/2019 0819 Gross per 24 hour  Intake 290 ml  Output 1710 ml  Net -1420 ml  JP with slightly more sanguinous discharge.  Constitutional :  alert, cooperative, appears stated age and no distress  Respiratory:  clear to auscultation bilaterally  Cardiovascular:  regular rate and rhythm, S1, S2 normal, no murmur, click, rub or gallop and regular rate and rhythm  Gastrointestinal: soft, non-tender; bowel sounds normal; no masses,  no organomegaly.  Distention improved compared to yesterday.  Skin: Cool and moist.  Midline incision intact with wound VAC atop it.  JP with sanguinous drainage, including around the drain site as well.  Psychiatric: Normal  affect, non-agitated, not confused       LABS:  CMP Latest Ref Rng & Units 03/08/2019 03/07/2019 01/22/2019  Glucose 70 - 99 mg/dL 103(H) 123(H) 98  BUN 6 - 20 mg/dL 8 10 17   Creatinine 0.61 - 1.24 mg/dL 0.95 0.95 0.93  Sodium 135 - 145 mmol/L 136 134(L) 138  Potassium 3.5 - 5.1 mmol/L 4.4 3.9 4.4  Chloride 98 - 111 mmol/L 101 102 107  CO2 22 - 32 mmol/L 27 26 25   Calcium 8.9 - 10.3 mg/dL 8.7(L) 8.6(L) 9.1  Total Protein 6.5 - 8.1 g/dL - 6.2(L) 7.3  Total Bilirubin 0.3 - 1.2 mg/dL - 0.7 0.6  Alkaline Phos 38 - 126 U/L - 46 61  AST 15 - 41 U/L - 50(H) 17  ALT 0 - 44 U/L - 14 12   CBC Latest Ref Rng & Units 03/08/2019 03/07/2019 01/22/2019  WBC 4.0 - 10.5 K/uL 15.6(H) 15.1(H) 8.9  Hemoglobin 13.0 - 17.0 g/dL 13.4 14.1 14.4  Hematocrit 39.0 - 52.0 % 42.5 44.9 45.5  Platelets 150 - 400 K/uL 231 263 296    RADS: n/a Assessment:   s/p colostomy takedown and abdominal wall reconstruction for symptomatic parastomal hernia, complicated by pertinent comorbidities including anxiety/depression.  Doing well.  Afebrile, no obvious clinical signs of an infection so unsure why leukocytosis is still present.  We will continue to monitor for now.  Hemoglobin is slowly downtrending and JP with still sanguinous drainage.  Will discontinue Toradol and Lovenox to see if any decrease in total output measured.  Pain is well controlled and bowel movements are present so will advance diet.

## 2019-03-10 LAB — SURGICAL PATHOLOGY

## 2019-03-10 MED ORDER — SIMETHICONE 80 MG PO CHEW
80.0000 mg | CHEWABLE_TABLET | Freq: Once | ORAL | Status: AC
  Administered 2019-03-10: 23:00:00 80 mg via ORAL
  Filled 2019-03-10: qty 1

## 2019-03-10 MED ORDER — SIMETHICONE 80 MG PO CHEW
80.0000 mg | CHEWABLE_TABLET | Freq: Once | ORAL | Status: AC
  Administered 2019-03-10: 15:00:00 80 mg via ORAL
  Filled 2019-03-10 (×2): qty 1

## 2019-03-10 NOTE — Progress Notes (Signed)
[facility] Day(s): 4.   Post op day(s): 4 Days Post-Op.   Interval History: Patient seen and examined, no acute events or new complaints overnight. Patient reports that he is doping well. He still reports intermittent abdominal soreness and "gas pain" but this is being managed well with current regiment. No fever, chills, nausea, or emesis. He is tolerating regular without issue. Passing flatus and with small BMs. Mobilizing well. JP drains with 240 out. No issues with wound vac. No other acute issues.  Vital signs in last 24 hours: [min-max] current  Temp:  [97.9 F (36.6 C)-98.6 F (37 C)] 97.9 F (36.6 C) (08/24 0548) Pulse Rate:  [75-93] 75 (08/24 0548) Resp:  [14-20] 20 (08/24 0548) BP: (128-133)/(77-92) 133/92 (08/24 0548) SpO2:  [98 %-100 %] 100 % (08/24 0548)     Height: 5\' 7"  (170.2 cm) Weight: 97.5 kg BMI (Calculated): 33.65   Intake/Output last 2 shifts:  08/23 0701 - 08/24 0700 In: -  Out: [id number] [Urine:1100; Drains:245]   Physical Exam:  Constitutional: alert, cooperative and no distress  Respiratory: breathing non-labored at rest  Gastrointestinal: soft, non-tender, and non-distended. JP in RLQ and LUQ with serosanguinous output Integumentary: Prevena to midline ad previous colostomy incisions, no leak, minimal serosanguinous output in container.    Labs:  CBC Latest Ref Rng & Units 03/08/2019 03/07/2019 01/22/2019  WBC 4.0 - 10.5 K/uL 15.6(H) 15.1(H) 8.9  Hemoglobin 13.0 - 17.0 g/dL 13.4 14.1 14.4  Hematocrit 39.0 - 52.0 % 42.5 44.9 45.5  Platelets 150 - 400 K/uL 231 263 296   CMP Latest Ref Rng & Units 03/08/2019 03/07/2019 01/22/2019  Glucose 70 - 99 mg/dL 103(H) 123(H) 98  BUN 6 - 20 mg/dL 8 10 17   Creatinine 0.61 - 1.24 mg/dL 0.95 0.95 0.93  Sodium 135 - 145 mmol/L 136 134(L) 138  Potassium 3.5 - 5.1 mmol/L 4.4 3.9 4.4  Chloride 98 - 111 mmol/L 101 102 107  CO2 22 - 32 mmol/L 27 26 25   Calcium 8.9 - 10.3 mg/dL  8.7(L) 8.6(L) 9.1  Total Protein 6.5 - 8.1 g/dL - 6.2(L) 7.3  Total Bilirubin 0.3 - 1.2 mg/dL - 0.7 0.6  Alkaline Phos 38 - 126 U/L - 46 61  AST 15 - 41 U/L - 50(H) 17  ALT 0 - 44 U/L - 14 12     Imaging studies: No new pertinent imaging studies   Assessment/Plan:  48 y.o. male overall doing well with return of bowel function 4 Days Post-Op s/p colostomy takedown and abdominal wall reconstruction for symptomatic parastomal hernia, complicated by pertinent comorbidities including anxiety/depression.   - Continue regular diet  - pain control prn (minimize narcotics)  - monitor abdominal examination; on-going bowel function  - monitor JP and wound vac output  - medical management of comorbidities; home medications   - mobilization encouraged   - Discharge Planning: Likely home in AM, will go with drains and wound vac, unable to get home health to remove wound vac so I will see him on Thursday of this week once discharged  All of the above findings and recommendations were discussed with the patient, patient's family, and the medical team, and all of patient's and family's questions were answered to their expressed satisfaction.  -- Edison Simon, PA-C [facility] 03/10/2019, 10:37 AM [phone] M-F: 7am - 4pm

## 2019-03-11 ENCOUNTER — Inpatient Hospital Stay: Payer: Self-pay

## 2019-03-11 LAB — BASIC METABOLIC PANEL
Anion gap: 10 (ref 5–15)
BUN: 8 mg/dL (ref 6–20)
CO2: 24 mmol/L (ref 22–32)
Calcium: 9 mg/dL (ref 8.9–10.3)
Chloride: 102 mmol/L (ref 98–111)
Creatinine, Ser: 0.76 mg/dL (ref 0.61–1.24)
GFR calc Af Amer: >60 mL/min (ref >60)
GFR calc non Af Amer: >60 mL/min (ref >60)
Glucose, Bld: 130 mg/dL — ABNORMAL HIGH (ref 70–99)
Potassium: 4 mmol/L (ref 3.5–5.1)
Sodium: 136 mmol/L (ref 135–145)

## 2019-03-11 LAB — CBC
HCT: 40.8 % (ref 39.0–52.0)
Hemoglobin: 13.1 g/dL (ref 13.0–17.0)
MCH: 25.9 pg — ABNORMAL LOW (ref 26.0–34.0)
MCHC: 32.1 g/dL (ref 30.0–36.0)
MCV: 80.8 fL (ref 80.0–100.0)
Platelets: 308 10*3/uL (ref 150–400)
RBC: 5.05 MIL/uL (ref 4.22–5.81)
RDW: 13.3 % (ref 11.5–15.5)
WBC: 9.6 10*3/uL (ref 4.0–10.5)
nRBC: 0 % (ref 0.0–0.2)

## 2019-03-11 MED ORDER — SIMETHICONE 80 MG PO CHEW
80.0000 mg | CHEWABLE_TABLET | Freq: Four times a day (QID) | ORAL | Status: DC | PRN
  Administered 2019-03-11 – 2019-03-14 (×3): 80 mg via ORAL
  Filled 2019-03-11 (×4): qty 1

## 2019-03-11 NOTE — Progress Notes (Signed)
The patient had an episode of nausea this evening not vomiting.

## 2019-03-11 NOTE — Plan of Care (Signed)
The patient has been nauseous today when eating. Nausea meds provided once. Narcotic use has been limited today. Ice pack provided to abdominal discomfort. The patient has been ambulated in the hallway. The patient has not been able to passed gas.  Problem: Education: Goal: Knowledge of General Education information will improve Description: Including pain rating scale, medication(s)/side effects and non-pharmacologic comfort measures Outcome: Progressing   Problem: Health Behavior/Discharge Planning: Goal: Ability to manage health-related needs will improve Outcome: Progressing   Problem: Clinical Measurements: Goal: Ability to maintain clinical measurements within normal limits will improve Outcome: Progressing Goal: Will remain free from infection Outcome: Progressing Goal: Diagnostic test results will improve Outcome: Progressing Goal: Respiratory complications will improve Outcome: Progressing Goal: Cardiovascular complication will be avoided Outcome: Progressing   Problem: Elimination: Goal: Will not experience complications related to bowel motility Outcome: Progressing Goal: Will not experience complications related to urinary retention Outcome: Progressing   Problem: Coping: Goal: Level of anxiety will decrease Outcome: Progressing   Problem: Pain Managment: Goal: General experience of comfort will improve Outcome: Progressing   Problem: Safety: Goal: Ability to remain free from injury will improve Outcome: Progressing   Problem: Skin Integrity: Goal: Risk for impaired skin integrity will decrease Outcome: Progressing

## 2019-03-11 NOTE — TOC Transition Note (Signed)
Transition of Care [facility]) - CM/SW Discharge Note   Patient Details  Name: [name] MRN: [id number] Date of Birth: 1970-11-03  Transition of Care [facility]) CM/SW Contact:  Beverly Sessions, RN Phone Number: 03/11/2019, 3:08 PM   Clinical Narrative:      Patient s/p ostomy take down Patient lives at home with wife.  Patient independent of all ADLS  Anticipate patient will discharge with 2 JP drains and prevena vac in place  PA initially ordered home health for 1 visit to remove prevena Vac.  Patient is not home bound, and removal of prevena vac is not skilled need to set up home health.  Per PA plan will be for patient to come to an office visit on Thursday for prevena removal  RNCM following for discharge medication needs       Patient Goals and CMS Choice        Discharge Placement                       Discharge Plan and Services                                     Social Determinants of Health (SDOH) Interventions     Readmission Risk Interventions No flowsheet data found.

## 2019-03-11 NOTE — Progress Notes (Signed)
[place] Hospital Day(s): 5.   Post op day(s): 5 Days Post-Op.   Interval History: Patient seen and examined, no acute events or new complaints overnight. Patient reports he had a rough night. Increase in abdominal pain, bloating, and nausea. He is still passing gas but his symptoms have kept him up all night. No fever, chills, or emesis. Mobilizing. Appetite decreased.   Vital signs in last 24 hours: [min-max] current  Temp:  [98.2 F (36.8 C)-99.1 F (37.3 C)] 98.4 F (36.9 C) (08/25 0451) Pulse Rate:  [72-89] 72 (08/25 0451) Resp:  [16-20] 20 (08/25 0451) BP: (135-137)/(90-102) 137/90 (08/25 0451) SpO2:  [98 %-99 %] 98 % (08/25 0451)     Height: 5\' 7"  (170.2 cm) Weight: 97.5 kg BMI (Calculated): 33.65   Intake/Output last 2 shifts:  08/24 0701 - 08/25 0700 In: 240 [P.O.:240] Out: 650 [Urine:500; Drains:150]   Physical Exam:  Constitutional: alert, cooperative and no distress  Respiratory: breathing non-labored at rest  Cardiovascular: regular rate and sinus rhythm  Gastrointestinal: soft, non-tender, mild distension, no rebound/guarding. JP in RLQ and LUQ with serosanguinous output Integumentary: Prevena to midline ad previous colostomy incisions, no leak, minimal serosanguinous output in container.   Labs:  CBC Latest Ref Rng & Units 03/08/2019 03/07/2019 01/22/2019  WBC 4.0 - 10.5 K/uL 15.6(H) 15.1(H) 8.9  Hemoglobin 13.0 - 17.0 g/dL 13.4 14.1 14.4  Hematocrit 39.0 - 52.0 % 42.5 44.9 45.5  Platelets 150 - 400 K/uL 231 263 296   CMP Latest Ref Rng & Units 03/08/2019 03/07/2019 01/22/2019  Glucose 70 - 99 mg/dL 103(H) 123(H) 98  BUN 6 - 20 mg/dL 8 10 17   Creatinine 0.61 - 1.24 mg/dL 0.95 0.95 0.93  Sodium 135 - 145 mmol/L 136 134(L) 138  Potassium 3.5 - 5.1 mmol/L 4.4 3.9 4.4  Chloride 98 - 111 mmol/L 101 102 107  CO2 22 - 32 mmol/L 27 26 25   Calcium 8.9 - 10.3 mg/dL 8.7(L) 8.6(L) 9.1  Total Protein 6.5 - 8.1 g/dL - 6.2(L) 7.3   Total Bilirubin 0.3 - 1.2 mg/dL - 0.7 0.6  Alkaline Phos 38 - 126 U/L - 46 61  AST 15 - 41 U/L - 50(H) 17  ALT 0 - 44 U/L - 14 12     Imaging studies: No new pertinent imaging studies   Assessment/Plan:  48 y.o. male with increase in abdominal discomfort and nausea concerning for post-op ileus 5 Days Post-Op s/p colostomy takedown and abdominal wall reconstructionfor symptomatic parastomal hernia, complicated by pertinent comorbidities includinganxiety/depression.   - Back down to clear liquid diet today   - Will recheck labs + KUB   - pain control prn (minimize narcotics)             - monitor abdominal examination; on-going bowel function             - monitor JP and wound vac output             - medical management of comorbidities; home medications              - mobilization encouraged  All of the above findings and recommendations were discussed with the patient, patient's family, and the medical team, and all of patient's and family's questions were answered to their expressed satisfaction.  -- Edison Simon, PA-C [facility] 03/11/2019, 7:47 AM [phone] M-F: 7am - 4pm

## 2019-03-12 ENCOUNTER — Inpatient Hospital Stay: Payer: Self-pay

## 2019-03-12 LAB — BASIC METABOLIC PANEL
Anion gap: 10 (ref 5–15)
BUN: 9 mg/dL (ref 6–20)
CO2: 26 mmol/L (ref 22–32)
Calcium: 8.9 mg/dL (ref 8.9–10.3)
Chloride: 99 mmol/L (ref 98–111)
Creatinine, Ser: 0.78 mg/dL (ref 0.61–1.24)
GFR calc Af Amer: >60 mL/min (ref >60)
GFR calc non Af Amer: >60 mL/min (ref >60)
Glucose, Bld: 113 mg/dL — ABNORMAL HIGH (ref 70–99)
Potassium: 3.8 mmol/L (ref 3.5–5.1)
Sodium: 135 mmol/L (ref 135–145)

## 2019-03-12 MED ORDER — NYSTATIN 100000 UNIT/ML MT SUSP
5.0000 mL | Freq: Four times a day (QID) | OROMUCOSAL | Status: DC
  Administered 2019-03-12 – 2019-03-14 (×8): 500000 [IU] via ORAL
  Filled 2019-03-12: qty 10
  Filled 2019-03-12 (×2): qty 5
  Filled 2019-03-12: qty 10
  Filled 2019-03-12 (×4): qty 5

## 2019-03-12 MED ORDER — LACTATED RINGERS IV SOLN
INTRAVENOUS | Status: DC
  Administered 2019-03-12 – 2019-03-14 (×2): via INTRAVENOUS

## 2019-03-12 NOTE — Progress Notes (Signed)
[place] Hospital Day(s): 6.   Post op day(s): 6 Days Post-Op.   Interval History: Patient seen and examined, no acute events or new complaints overnight. Patient reports he is still having nausea and had one small episode of emesis this morning. No abdominal pain, fever, chills. He is still mobilizing. Reports multiple episodes of flatus. JP drains with 50 and 7 ccs out. No issues with Prevena, tomorrow is 7 days. No other issues.    Vital signs in last 24 hours: [min-max] current  Temp:  [97.8 F (36.6 C)-98.8 F (37.1 C)] 97.8 F (36.6 C) (08/26 0602) Pulse Rate:  [71-80] 71 (08/26 0602) Resp:  [16-20] 20 (08/26 0602) BP: (133-152)/(87-94) 152/93 (08/26 0602) SpO2:  [99 %-100 %] 99 % (08/26 0602)     Height: 5\' 7"  (170.2 cm) Weight: 97.5 kg BMI (Calculated): 33.65   Intake/Output last 2 shifts:  08/25 0701 - 08/26 0700 In: 120 [P.O.:120] Out: 957 [Urine:900; Drains:57]   Physical Exam:  Constitutional: alert, cooperative and no distress  Respiratory: breathing non-labored at rest  Cardiovascular: regular rate and sinus rhythm  Gastrointestinal: soft, non-tender, mild distension, tympanic to percussion. JP in RLQ and LUQ with serosanguinous output Integumentary: Prevena to midline ad previous colostomy incisions, no leak, minimal serosanguinous output in container.   Labs:  CBC Latest Ref Rng & Units 03/11/2019 03/08/2019 03/07/2019  WBC 4.0 - 10.5 K/uL 9.6 15.6(H) 15.1(H)  Hemoglobin 13.0 - 17.0 g/dL 13.1 13.4 14.1  Hematocrit 39.0 - 52.0 % 40.8 42.5 44.9  Platelets 150 - 400 K/uL 308 231 263   CMP Latest Ref Rng & Units 03/11/2019 03/08/2019 03/07/2019  Glucose 70 - 99 mg/dL 130(H) 103(H) 123(H)  BUN 6 - 20 mg/dL 8 8 10   Creatinine 0.61 - 1.24 mg/dL 0.76 0.95 0.95  Sodium 135 - 145 mmol/L 136 136 134(L)  Potassium 3.5 - 5.1 mmol/L 4.0 4.4 3.9  Chloride 98 - 111 mmol/L 102 101 102  CO2 22 - 32 mmol/L 24 27 26   Calcium 8.9 -  10.3 mg/dL 9.0 8.7(L) 8.6(L)  Total Protein 6.5 - 8.1 g/dL - - 6.2(L)  Total Bilirubin 0.3 - 1.2 mg/dL - - 0.7  Alkaline Phos 38 - 126 U/L - - 46  AST 15 - 41 U/L - - 50(H)  ALT 0 - 44 U/L - - 14     Imaging studies: No new pertinent imaging studies, KUB this morning pending   Assessment/Plan:  48 y.o. male still with post-op ileus 6 Days Post-Op s/p colostomy takedown and abdominal wall reconstructionfor symptomatic parastomal hernia, complicated by pertinent comorbidities includinganxiety/depression   - Clear liquids as tolerates, NPO if nausea/emesis worsen  - Discuss possible role of NGT for decompression if nausea/emesis worsen, we can hold off for now.   - Repeat labs to monitor renal function + electrolytes; restart IVF at maintenance rate.    - pain control prn (minimize narcotics) - monitor abdominal examination; on-going bowel function - monitor JP and wound vac output; he has about 24 hours left on wound vac - medical management of comorbidities; home medications - mobilization encouraged   All of the above findings and recommendations were discussed with the patient, and the medical team, and all of patient's questions were answered to his expressed satisfaction.  -- Edison Simon, PA-C [facility] 03/12/2019, 7:57 AM [phone] M-F: 7am - 4pm

## 2019-03-12 NOTE — Plan of Care (Signed)
The patient has been having nausea today. Nausea medications provided PRN. The patient has a small bowel movement. No falls. PRN pain meds given. Narcotic used has been avoided. The patient has walked in the hallway today. Started on oral nystatin.  Problem: Education: Goal: Knowledge of General Education information will improve Description: Including pain rating scale, medication(s)/side effects and non-pharmacologic comfort measures Outcome: Progressing   Problem: Health Behavior/Discharge Planning: Goal: Ability to manage health-related needs will improve Outcome: Progressing   Problem: Clinical Measurements: Goal: Ability to maintain clinical measurements within normal limits will improve Outcome: Progressing Goal: Will remain free from infection Outcome: Progressing Goal: Diagnostic test results will improve Outcome: Progressing Goal: Respiratory complications will improve Outcome: Progressing Goal: Cardiovascular complication will be avoided Outcome: Progressing

## 2019-03-13 MED ORDER — BOOST / RESOURCE BREEZE PO LIQD CUSTOM
1.0000 | Freq: Three times a day (TID) | ORAL | Status: DC
  Administered 2019-03-13 – 2019-03-14 (×5): 1 via ORAL

## 2019-03-13 NOTE — Progress Notes (Signed)
[place] Hospital Day(s): 7.   Post op day(s): 7 Days Post-Op.   Interval History: Patient seen and examined, no acute events or new complaints overnight. Patient reports that he is feeling better this morning. Abdominal distension and discomfort improved. No more nausea or emesis. Tolerated sips yesterday. Still passing gas. No issues with JP drains or wound vac. No other issues.    Vital signs in last 24 hours: [min-max] current  Temp:  [98.4 F (36.9 C)-99.3 F (37.4 C)] 98.4 F (36.9 C) (08/27 0610) Pulse Rate:  [70-80] 80 (08/27 0610) Resp:  [17-20] 20 (08/27 0610) BP: (133-151)/(86-95) 133/91 (08/27 0610) SpO2:  [99 %-100 %] 99 % (08/27 0610)     Height: 5\' 7"  (170.2 cm) Weight: 97.5 kg BMI (Calculated): 33.65   Intake/Output last 2 shifts:  08/26 0701 - 08/27 0700 In: 961 [I.V.:961] Out: 1175 [Urine:1050; Drains:125]   Physical Exam:  Constitutional: alert, cooperative and no distress  Respiratory: breathing non-labored at rest  Cardiovascular: regular rate and sinus rhythm  Gastrointestinal: soft, non-tender, distension improved. JP in RLQ and LUQ with serosanguinous output Integumentary: Prevena to midline ad previous colostomy incisions, no leak, minimal serosanguinous output in container.   Labs:  CBC Latest Ref Rng & Units 03/11/2019 03/08/2019 03/07/2019  WBC 4.0 - 10.5 K/uL 9.6 15.6(H) 15.1(H)  Hemoglobin 13.0 - 17.0 g/dL 13.1 13.4 14.1  Hematocrit 39.0 - 52.0 % 40.8 42.5 44.9  Platelets 150 - 400 K/uL 308 231 263   CMP Latest Ref Rng & Units 03/12/2019 03/11/2019 03/08/2019  Glucose 70 - 99 mg/dL 113(H) 130(H) 103(H)  BUN 6 - 20 mg/dL 9 8 8   Creatinine 0.61 - 1.24 mg/dL 0.78 0.76 0.95  Sodium 135 - 145 mmol/L 135 136 136  Potassium 3.5 - 5.1 mmol/L 3.8 4.0 4.4  Chloride 98 - 111 mmol/L 99 102 101  CO2 22 - 32 mmol/L 26 24 27   Calcium 8.9 - 10.3 mg/dL 8.9 9.0 8.7(L)  Total Protein 6.5 - 8.1 g/dL - - -  Total  Bilirubin 0.3 - 1.2 mg/dL - - -  Alkaline Phos 38 - 126 U/L - - -  AST 15 - 41 U/L - - -  ALT 0 - 44 U/L - - -     Imaging studies: No new pertinent imaging studies   Assessment/Plan:  48 y.o. male with improving/resolving post-surgical ileus 7 Days Post-Op s/p colostomy takedown and abdominal wall reconstructionfor symptomatic parastomal hernia, complicated by pertinent comorbidities includinganxiety/depression   - Advance to CLD today   - Follow up labs in the morning  - pain control prn (minimize narcotics); antiemetics prn  - monitor abdominal examination; on-going bowel function - monitor JP and wound vac output; he has about 24 hours left on wound vac and will remove LUQ JP tomorrow - medical management of comorbidities; home medications - mobilization encouraged   All of the above findings and recommendations were discussed with the patient, patient's family, and the medical team, and all of patient's and family's questions were answered to their expressed satisfaction.  -- Edison Simon, PA-C [facility] 03/13/2019, 7:38 AM [phone] M-F: 7am - 4pm

## 2019-03-14 ENCOUNTER — Other Ambulatory Visit: Payer: Self-pay

## 2019-03-14 ENCOUNTER — Encounter: Payer: Self-pay | Admitting: Emergency Medicine

## 2019-03-14 DIAGNOSIS — Z87891 Personal history of nicotine dependence: Secondary | ICD-10-CM | POA: Insufficient documentation

## 2019-03-14 DIAGNOSIS — F419 Anxiety disorder, unspecified: Secondary | ICD-10-CM | POA: Insufficient documentation

## 2019-03-14 DIAGNOSIS — R112 Nausea with vomiting, unspecified: Secondary | ICD-10-CM | POA: Insufficient documentation

## 2019-03-14 DIAGNOSIS — R1084 Generalized abdominal pain: Secondary | ICD-10-CM | POA: Insufficient documentation

## 2019-03-14 DIAGNOSIS — Z79899 Other long term (current) drug therapy: Secondary | ICD-10-CM | POA: Insufficient documentation

## 2019-03-14 LAB — COMPREHENSIVE METABOLIC PANEL
ALT: 21 U/L (ref 0–44)
AST: 16 U/L (ref 15–41)
Albumin: 4.4 g/dL (ref 3.5–5.0)
Alkaline Phosphatase: 64 U/L (ref 38–126)
Anion gap: 9 (ref 5–15)
BUN: 9 mg/dL (ref 6–20)
CO2: 25 mmol/L (ref 22–32)
Calcium: 9 mg/dL (ref 8.9–10.3)
Chloride: 99 mmol/L (ref 98–111)
Creatinine, Ser: 0.96 mg/dL (ref 0.61–1.24)
GFR calc Af Amer: >60 mL/min (ref >60)
GFR calc non Af Amer: >60 mL/min (ref >60)
Glucose, Bld: 116 mg/dL — ABNORMAL HIGH (ref 70–99)
Potassium: 3.8 mmol/L (ref 3.5–5.1)
Sodium: 133 mmol/L — ABNORMAL LOW (ref 135–145)
Total Bilirubin: 1.1 mg/dL (ref 0.3–1.2)
Total Protein: 7.5 g/dL (ref 6.5–8.1)

## 2019-03-14 LAB — CBC
HCT: 39.8 % (ref 39.0–52.0)
HCT: 39.8 % (ref 39.0–52.0)
Hemoglobin: 12.7 g/dL — ABNORMAL LOW (ref 13.0–17.0)
Hemoglobin: 13 g/dL (ref 13.0–17.0)
MCH: 26.3 pg (ref 26.0–34.0)
MCH: 26.2 pg (ref 26.0–34.0)
MCHC: 31.9 g/dL (ref 30.0–36.0)
MCHC: 32.7 g/dL (ref 30.0–36.0)
MCV: 82.6 fL (ref 80.0–100.0)
MCV: 80.1 fL (ref 80.0–100.0)
Platelets: 424 10*3/uL — ABNORMAL HIGH (ref 150–400)
Platelets: 481 10*3/uL — ABNORMAL HIGH (ref 150–400)
RBC: 4.82 MIL/uL (ref 4.22–5.81)
RBC: 4.97 MIL/uL (ref 4.22–5.81)
RDW: 13.4 % (ref 11.5–15.5)
RDW: 13.4 % (ref 11.5–15.5)
WBC: 11.9 10*3/uL — ABNORMAL HIGH (ref 4.0–10.5)
WBC: 15.4 10*3/uL — ABNORMAL HIGH (ref 4.0–10.5)
nRBC: 0 % (ref 0.0–0.2)
nRBC: 0 % (ref 0.0–0.2)

## 2019-03-14 LAB — BASIC METABOLIC PANEL
Anion gap: 10 (ref 5–15)
BUN: 10 mg/dL (ref 6–20)
CO2: 25 mmol/L (ref 22–32)
Calcium: 8.9 mg/dL (ref 8.9–10.3)
Chloride: 102 mmol/L (ref 98–111)
Creatinine, Ser: 0.96 mg/dL (ref 0.61–1.24)
GFR calc Af Amer: >60 mL/min (ref >60)
GFR calc non Af Amer: >60 mL/min (ref >60)
Glucose, Bld: 117 mg/dL — ABNORMAL HIGH (ref 70–99)
Potassium: 3.6 mmol/L (ref 3.5–5.1)
Sodium: 137 mmol/L (ref 135–145)

## 2019-03-14 LAB — LIPASE, BLOOD: Lipase: 19 U/L (ref 11–51)

## 2019-03-14 LAB — MAGNESIUM: Magnesium: 1.9 mg/dL (ref 1.7–2.4)

## 2019-03-14 MED ORDER — ONDANSETRON HCL 4 MG/2ML IJ SOLN
4.0000 mg | Freq: Once | INTRAMUSCULAR | Status: AC | PRN
  Administered 2019-03-14: 4 mg via INTRAVENOUS
  Filled 2019-03-14: qty 2

## 2019-03-14 MED ORDER — IBUPROFEN 800 MG PO TABS: 800.0000 mg | ORAL_TABLET | Freq: Three times a day (TID) | ORAL | 0 refills | Status: DC | PRN

## 2019-03-14 MED ORDER — FENTANYL CITRATE (PF) 100 MCG/2ML IJ SOLN
50.0000 ug | INTRAMUSCULAR | Status: DC | PRN
  Administered 2019-03-14: 50 ug via INTRAVENOUS
  Filled 2019-03-14: qty 2

## 2019-03-14 MED ORDER — CYCLOBENZAPRINE HCL 5 MG PO TABS: 5.0000 mg | ORAL_TABLET | Freq: Three times a day (TID) | ORAL | 0 refills | Status: DC

## 2019-03-14 MED ORDER — OXYCODONE HCL 5 MG PO TABS: 5.0000 mg | ORAL_TABLET | Freq: Four times a day (QID) | ORAL | 0 refills | Status: DC | PRN

## 2019-03-14 NOTE — ED Triage Notes (Signed)
Patient states that he had a colostomy reversal last Thursday. Patient states that he was discharged today. Patient states that after he went home today he started vomiting and increase pain. Patient states that he is still passing gas and has had a BM.

## 2019-03-14 NOTE — Progress Notes (Signed)
[name] to be D/C'd home per MD order.  Discussed prescriptions and follow up appointments with the patient. Prescriptions given to patient, medication list explained in detail. Pt verbalized understanding.  Allergies as of 03/14/2019      Reactions   Bee Venom Shortness Of Breath, Swelling      Medication List    STOP taking these medications   bisacodyl 5 MG EC tablet Generic drug: bisacodyl   erythromycin 500 MG EC tablet Commonly known as: PCE   neomycin 500 MG tablet Commonly known as: MYCIFRADIN   polyethylene glycol powder 17 GM/SCOOP powder Commonly known as: GLYCOLAX/MIRALAX     TAKE these medications   acetaminophen 325 MG tablet Commonly known as: TYLENOL Take 2 tablets (650 mg total) by mouth every 6 (six) hours.   cyclobenzaprine 5 MG tablet Commonly known as: FLEXERIL Take 1 tablet (5 mg total) by mouth 3 (three) times daily.   docusate sodium 100 MG capsule Commonly known as: COLACE Take 100 mg by mouth daily.   famotidine 20 MG tablet Commonly known as: PEPCID Take 1 tablet (20 mg total) by mouth daily. To decrease stomach acid   FLUoxetine 10 MG capsule Commonly known as: PROZAC Take 1 capsule (10 mg total) by mouth daily. To help with anxiety What changed: when to take this   ibuprofen 800 MG tablet Commonly known as: ADVIL Take 1 tablet (800 mg total) by mouth every 8 (eight) hours as needed for moderate pain.   oxyCODONE 5 MG immediate release tablet Commonly known as: Oxy IR/ROXICODONE Take 1 tablet (5 mg total) by mouth every 6 (six) hours as needed for severe pain or breakthrough pain.   vitamin E 400 UNIT capsule Take 1 capsule (400 Units total) by mouth daily.       Vitals:   03/14/19 0515 03/14/19 1323  BP: (!) 132/91 (!) 143/79  Pulse: 93 87  Resp: 20 18  Temp: 97.8 F (36.6 C) 98.6 F (37 C)  SpO2: 97% 100%    Skin clean, dry and intact without evidence of skin break down, no evidence of skin tears noted. IV catheter  discontinued intact. Site without signs and symptoms of complications. Dressing and pressure applied. Pt denies pain at this time. No complaints noted.  JP drain education completed and patient demonstrated teachback  An After Visit Summary was printed and given to the patient. Patient escorted via [place], and D/C home via private auto.  Alaze Garverick A Athina Fahey

## 2019-03-14 NOTE — Discharge Instructions (Signed)
In addition to included general post-operative instructions for colostomy takedown and abdominal wall reconstruction,  Diet: Resume home diet.   Activity: No heavy lifting >20 pounds (children, pets, laundry, garbage) or strenuous activity for 6 weeks total post-op, but light activity and walking are encouraged. Do not drive or drink alcohol if taking narcotic pain medications or having pain that might distract from driving.  Wound care: You may shower/get incision wet with soapy water and pat dry (do not rub incisions), but no baths or submerging incision underwater until follow-up. KEEP DRAIN SITE COVERED  Medications: Resume all home medications. For mild to moderate pain: acetaminophen (Tylenol) or ibuprofen/naproxen (if no kidney disease). Combining Tylenol with alcohol can substantially increase your risk of causing liver disease. Narcotic pain medications, if prescribed, can be used for severe pain, though may cause nausea, constipation, and drowsiness. Do not combine Tylenol and Percocet (or similar) within a 6 hour period as Percocet (and similar) contain(s) Tylenol. If you do not need the narcotic pain medication, you do not need to fill the prescription.  Call office [phone] / [phone]) at any time if any questions, worsening pain, fevers/chills, bleeding, drainage from incision site, or other concerns.

## 2019-03-14 NOTE — Discharge Summary (Signed)
[facility] SUR[facility]ICAL DISCHARGE SUMMARY  Patient ID: [name] MRN: [id number] DOB/AGE: 03/04/1971 48 y.o.  Admit date: 03/06/2019 Discharge date: 03/14/2019  Discharge Diagnoses Patient Active Problem List   Diagnosis Date Noted  . S/P colostomy takedown 03/06/2019    Consultants None  Procedures 03/06/2019: 1. Abdominal wall reconstruction with bilateral myocutaneous flaps using a TAR release on the Left and Rives-Stoppa release on the Right.   30 x 30 cm  Phasix ST Mesh used. 2. Incisional hernia repair 3. Appendectomy 4. Colostomy takedown 5. 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 He has done well and presents on 08/20 or scheduled colostomy takedown and abdominal wall reconstruction with Dr Dahlia Byes.   Hospital Course: Informed consent was obtained and documented, and patient underwent uneventful colostomy takedown and abdominal wall reconstruction (Dr Dahlia Byes, 03/06/2019).  Post-operatively, patient initially did very well. He tolerated initial advancement of his diet and was mobilizing well. On POD #6 he developed a [post-surgical ileus. This persisted for about 24 hours before resolving. On POD7, he had return of bowel function and diet advance was tolerated. Ambulation were well-tolerated. His prevena vac was removed on day of discharge (08/28) and his LUQ drain which was in the subcutaneous space was removed as well. He will go home with RLQ deep drain. The remainder of patient's hospital course was essentially unremarkable, and discharge planning was initiated accordingly with patient safely able to be discharged home with appropriate discharge instructions, pain control, and outpatient follow-up after all of his questions were answered to his expressed satisfaction.   Discharge Condition: Good    Allergies as of 03/14/2019      Reactions    Bee Venom Shortness Of Breath, Swelling      Medication List    STOP taking these medications   bisacodyl 5 MG EC tablet Generic drug: bisacodyl   erythromycin 500 MG EC tablet Commonly known as: PCE   neomycin 500 MG tablet Commonly known as: MYCIFRADIN   polyethylene glycol powder 17 GM/SCOOP powder Commonly known as: GLYCOLAX/MIRALAX     TAKE these medications   acetaminophen 325 MG tablet Commonly known as: TYLENOL Take 2 tablets (650 mg total) by mouth every 6 (six) hours.   cyclobenzaprine 5 MG tablet Commonly known as: FLEXERIL Take 1 tablet (5 mg total) by mouth 3 (three) times daily.   docusate sodium 100 MG capsule Commonly known as: COLACE Take 100 mg by mouth daily.   famotidine 20 MG tablet Commonly known as: PEPCID Take 1 tablet (20 mg total) by mouth daily. To decrease stomach acid   FLUoxetine 10 MG capsule Commonly known as: PROZAC Take 1 capsule (10 mg total) by mouth daily. To help with anxiety What changed: when to take this   ibuprofen 800 MG tablet Commonly known as: ADVIL Take 1 tablet (800 mg total) by mouth every 8 (eight) hours as needed for moderate pain.   oxyCODONE 5 MG immediate release tablet Commonly known as: Oxy IR/ROXICODONE Take 1 tablet (5 mg total) by mouth every 6 (six) hours as needed for severe pain or breakthrough pain.   vitamin E 400 UNIT capsule Take 1 capsule (400 Units total) by mouth daily.        Follow-up Information    Pabon, [place] F, MD. Schedule an appointment as soon as possible for a visit on 03/19/2019.   Specialty: General Surgery Why: s/p  colostomy takedown and abdominal wall reconstruction, still with JP drain in RLQ....should be okay to see Thedore Mins PA if needed Contact information: [address] [place] [address] [phone]            Time spent on discharge management including discussion of hospital course, clinical condition, outpatient instructions,  prescriptions, and follow up with the patient and members of the medical team: >30 minutes  -- Edison Simon , PA-C [facility]  03/14/2019, 3:13 PM [phone] M-F: 7am - 4pm

## 2019-03-14 NOTE — Progress Notes (Signed)
[facility] Day(s): 8.   Post op day(s): 8 Days Post-Op.   Interval History: Patient seen and examined, no acute events or new complaints overnight. Patient reports that he is feeling much better this morning. No abdominal pain, nausea, or emesis. No fever, chills. He continues to pass gas and has had 3 BMs in last 24 hours. No issues with mobilization. No issues with Prevena. JP in LUQ with 12 ccs out. JP in RLQ with 140 ccs. No other issues.   Vital signs in last 24 hours: [min-max] current  Temp:  [97.8 F (36.6 C)-98.4 F (36.9 C)] 97.8 F (36.6 C) (08/28 0515) Pulse Rate:  [86-93] 93 (08/28 0515) Resp:  [16-20] 20 (08/28 0515) BP: (120-132)/(77-91) 132/91 (08/28 0515) SpO2:  [97 %-100 %] 97 % (08/28 0515)     Height: 5\' 7"  (170.2 cm) Weight: 97.5 kg BMI (Calculated): 33.65   Intake/Output last 2 shifts:  08/27 0701 - 08/28 0700 In: 982.3 [P.O.:480; I.V.:502.3] Out: 1297 [Urine:1150; Drains:147]   Physical Exam:  Constitutional: alert, cooperative and no distress  Respiratory: breathing non-labored at rest  Cardiovascular: regular rate and sinus rhythm  Gastrointestinal: soft, non-tender, distension resolved.JP in RLQ and LUQ with serosanguinous output Integumentary:Prevena remove, midline and previous colostomy incisions CDI with staples. No erythema or drainage.    Labs:  CBC Latest Ref Rng & Units 03/14/2019 03/11/2019 03/08/2019  WBC 4.0 - 10.5 K/uL 11.9(H) 9.6 15.6(H)  Hemoglobin 13.0 - 17.0 g/dL 12.7(L) 13.1 13.4  Hematocrit 39.0 - 52.0 % 39.8 40.8 42.5  Platelets 150 - 400 K/uL 424(H) 308 231   CMP Latest Ref Rng & Units 03/14/2019 03/12/2019 03/11/2019  Glucose 70 - 99 mg/dL 117(H) 113(H) 130(H)  BUN 6 - 20 mg/dL 10 9 8   Creatinine 0.61 - 1.24 mg/dL 0.96 0.78 0.76  Sodium 135 - 145 mmol/L 137 135 136  Potassium 3.5 - 5.1 mmol/L 3.6 3.8 4.0  Chloride 98 - 111 mmol/L 102 99 102  CO2 22 - 32 mmol/L 25 26 24   Calcium  8.9 - 10.3 mg/dL 8.9 8.9 9.0  Total Protein 6.5 - 8.1 g/dL - - -  Total Bilirubin 0.3 - 1.2 mg/dL - - -  Alkaline Phos 38 - 126 U/L - - -  AST 15 - 41 U/L - - -  ALT 0 - 44 U/L - - -     Imaging studies: No new pertinent imaging studies   Assessment/Plan:  48 y.o. male with resolved post-surgical ileus 8 Days Post-Op s/p colostomy takedown and abdominal wall reconstructionfor symptomatic parastomal hernia, complicated by pertinent comorbidities includinganxiety/depression   - Advance to full liquids; soft diet okay for dinner if tolerates   - Discontinue IVF  - pain control prn (minimize narcotics); antiemetics prn             - monitor abdominal examination; on-going bowel function    - Removed Prevena at bedside; remove LUQ JP today  - Keep RLQ JP; will go home with this   - medical management of comorbidities; home medications - mobilization encouraged   - Discharge planning; if tolerates advancement of diet today hopefully home in 24-48 hours. Will go home with RLQ JP. Follow up in ~5 days for drain removal   All of the above findings and recommendations were discussed with the patient, and the medical team, and all of patient's questions were answered to his expressed satisfaction.  -- Edison Simon, PA-C [facility] 03/14/2019,  8:18 AM [phone] M-F: 7am - 4pm

## 2019-03-15 ENCOUNTER — Emergency Department: Payer: Self-pay

## 2019-03-15 ENCOUNTER — Emergency Department
Admission: EM | Admit: 2019-03-15 | Discharge: 2019-03-15 | Disposition: A | Discharge status: Home / Self Care | Payer: Self-pay | Attending: Emergency Medicine | Admitting: Emergency Medicine

## 2019-03-15 ENCOUNTER — Encounter: Payer: Self-pay | Admitting: Radiology

## 2019-03-15 DIAGNOSIS — R112 Nausea with vomiting, unspecified: Secondary | ICD-10-CM

## 2019-03-15 DIAGNOSIS — F419 Anxiety disorder, unspecified: Secondary | ICD-10-CM

## 2019-03-15 DIAGNOSIS — R1084 Generalized abdominal pain: Secondary | ICD-10-CM

## 2019-03-15 LAB — URINALYSIS, COMPLETE (UACMP) WITH MICROSCOPIC
Bacteria, UA: NONE SEEN
Bilirubin Urine: NEGATIVE
Glucose, UA: NEGATIVE mg/dL
Ketones, ur: NEGATIVE mg/dL
Leukocytes,Ua: NEGATIVE
Nitrite: NEGATIVE
Protein, ur: NEGATIVE mg/dL
Specific Gravity, Urine: 1.016 (ref 1.005–1.030)
Squamous Epithelial / HPF: NONE SEEN (ref 0–5)
pH: 6 (ref 5.0–8.0)

## 2019-03-15 MED ORDER — FLUOXETINE HCL 10 MG PO CAPS: 10.0000 mg | ORAL_CAPSULE | Freq: Two times a day (BID) | ORAL | 0 refills | Status: DC

## 2019-03-15 MED ORDER — IOHEXOL 300 MG/ML  SOLN
100.0000 mL | Freq: Once | INTRAMUSCULAR | Status: AC | PRN
  Administered 2019-03-15: 100 mL via INTRAVENOUS

## 2019-03-15 MED ORDER — IOHEXOL 240 MG/ML SOLN
25.0000 mL | INTRAMUSCULAR | Status: AC
  Administered 2019-03-15: 25 mL via ORAL

## 2019-03-15 MED ORDER — SODIUM CHLORIDE 0.9 % IV BOLUS
1000.0000 mL | Freq: Once | INTRAVENOUS | Status: AC
  Administered 2019-03-15: 1000 mL via INTRAVENOUS

## 2019-03-15 MED ORDER — LORAZEPAM 2 MG/ML IJ SOLN
0.5000 mg | Freq: Once | INTRAMUSCULAR | Status: AC
  Administered 2019-03-15: 0.5 mg via INTRAVENOUS
  Filled 2019-03-15: qty 1
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
## 2019-03-15 NOTE — ED Notes (Signed)
ED Provider at bedside. 

## 2019-03-15 NOTE — ED Notes (Signed)
Patient is alert and oriented.  He has finished CT contrast and is waiting for CT.

## 2019-03-15 NOTE — Discharge Instructions (Addendum)
Thank you for letting us take care of you in the emergency department today.  ° °Please continue to take your regular, prescribed medications.  ° °Please follow up with: °- Your primary care doctor to review your ER visit and follow up on your symptoms.  ° °Please return to the ER for any new or worsening symptoms.  ° °

## 2019-03-15 NOTE — ED Notes (Signed)
Patient given warm blanket and given update of wait time.

## 2019-03-15 NOTE — ED Provider Notes (Signed)
7:00 AM Assuming care from Dr. Beather Arbour.    In short, [name] is a 48 y.o. male with recent colostomy take down on 8/20 who presented for nausea, vomiting after drinking an entire bottle of Gatorade to try to soothe an anxiety attack.  Refer to the original H&P for additional details.  Current plan of care is to follow-up CT scan, if negative anticipate discharge.  CT scan without acute findings.  Will proceed with discharge as planned.  Refill for his Prozac written by Dr. Beather Arbour.  Given return precautions.  CT: IMPRESSION: 1. Relatively similar appearance of the abdomen and pelvis since yesterday's CT. Expected postoperative findings, including presumed mild ileus and trace, decreased extraluminal gas. Small volume cul-de-sac fluid, without abscess or specific findings of anastomotic breakdown. 2. Aortic Atherosclerosis (ICD10-I70.0). 3. Similar right lung base opacities which are suspicious for mild infection or aspiration.     Lilia Pro., MD 03/15/19 1017

## 2019-03-15 NOTE — ED Notes (Signed)
Patient transported to CT 

## 2019-03-15 NOTE — ED Notes (Signed)
Patient c/o N/V and anxiety post colostomy reversal yesterday. Patient believes that he may have switched to soft foods to quickly from his liquid diet.

## 2019-03-19 ENCOUNTER — Encounter: Payer: Self-pay | Admitting: Physician Assistant

## 2019-03-19 ENCOUNTER — Ambulatory Visit (INDEPENDENT_AMBULATORY_CARE_PROVIDER_SITE_OTHER): Payer: Self-pay | Admitting: Physician Assistant

## 2019-03-19 ENCOUNTER — Other Ambulatory Visit: Payer: Self-pay

## 2019-03-19 VITALS — BP 167/82 | HR 80 | Temp 97.5°F | Ht 67.0 in | Wt 194.4 lb

## 2019-03-19 DIAGNOSIS — Z09 Encounter for follow-up examination after completed treatment for conditions other than malignant neoplasm: Secondary | ICD-10-CM

## 2019-03-19 NOTE — Progress Notes (Signed)
[facility] POST-OP OFFICE VISIT  03/19/2019  HPI: [name] is a 48 y.o. male 14 days s/p colostomy take down and abdominal wall reconstruction which was complicated by post-surgical ileus. Overall, he reports that he is doing much better. He did go to the ED on 08/29 due to not having anxiety medications. He was worked up in the ED which did not show any concerning intra-abdominal findings. Today, he reports that his abdominal pain is improved. No issues with fever, chills, nausea, or emesis. He is tolerating a diet. Continues to tolerate light activity., Bowel movements normalizing. JP still with ~75 ccs a day of serosanguinous output.   Vital signs: BP (!) 167/82   Pulse 80   Temp (!) 97.5 F (36.4 C)   Ht 5\' 7"  (1.702 m)   Wt 194 lb 6.4 oz (88.2 kg)   SpO2 98%   BMI 30.45 kg/m    Physical Exam: Constitutional: Well appearing male, NAD Abdomen: Soft, non-tender, non-distended. JP in RLQ with about 50 ccs or serosanguinous drainage.  Skin: Laparotomy and previous colostomy site are well healing, no erythema or drainage, staples removed.   Assessment/Plan: This is a 48 y.o. male 14 days s/p colostomy takedown and abdominal wall reconstruction   - pain control prn  - staples removed, steri-strips placed  - continue JP for now, monitor at home, would like to see amount decrease some before removal.   - continue lifting restrictions; total of at least 6 weeks  - monitor bowel function  - return to clinic early net week for drain removal   -- Edison Simon, PA-C [facility] 03/19/2019, 10:22 AM [phone] M-F: 7am - 4pm

## 2019-03-19 NOTE — Patient Instructions (Signed)
You may shower. Pat incision site dry. The steri strips will start to fall off in 1-2 weeks.  Follow up here next week for possible drain removal. Bring in your drain sheet when you return.   Lifting restrictions are still in place.

## 2019-03-25 ENCOUNTER — Ambulatory Visit (INDEPENDENT_AMBULATORY_CARE_PROVIDER_SITE_OTHER): Payer: Medicaid Other | Admitting: Physician Assistant

## 2019-03-25 ENCOUNTER — Encounter: Payer: Self-pay | Admitting: Physician Assistant

## 2019-03-25 ENCOUNTER — Other Ambulatory Visit: Payer: Self-pay

## 2019-03-25 VITALS — BP 120/70 | HR 87 | Temp 98.6°F | Ht 67.0 in | Wt 197.8 lb

## 2019-03-25 DIAGNOSIS — Z09 Encounter for follow-up examination after completed treatment for conditions other than malignant neoplasm: Secondary | ICD-10-CM

## 2019-03-25 DIAGNOSIS — Z9889 Other specified postprocedural states: Secondary | ICD-10-CM

## 2019-03-25 NOTE — Patient Instructions (Signed)
Follow up in 2-3 weeks with Thedore Mins.  Call us with any questions prior to that.

## 2019-03-25 NOTE — Progress Notes (Signed)
[facility] POST-OP OFFICE VISIT  03/25/2019  HPI: [name] is a 48 y.o. male 19 days s/p colostomy takedown and abdominal wall reconstruction with Dr Dahlia Byes.   Since last visit, he continues to do well. Pain gradually improving and weaning from narcotic pain medications. No issues with nausea, emesis. Tolerating a diet with continued bowel function. JP with ~50 ccs of serous drainage daily. No acute issues.   Vital signs: BP 120/70   Pulse 87   Temp 98.6 F (37 C)   Ht 5\' 7"  (1.702 m)   Wt 197 lb 12.8 oz (89.7 kg)   SpO2 97%   BMI 30.98 kg/m    Physical Exam: Constitutional: Well appearing male, NAD Abdomen: Soft, non-tender, non-distended, no rebound/guarding. JP in RLQ with serous drainage - removed today Skin: Laparotomy and previous colostomy sites are well healed, no erythema or drainage. There is 1 cm area in the medial portion of the wound which is less approximated, scabbed over.   Assessment/Plan: This is a 48 y.o. male 19 days s/p colostomy takedown and abdominal wall reconstruction   - Drain removed today, occlusive dressing placed  - pain control prn (wean from narcotics)   - do not submerge wounds  - continue lifting restrictions; at least 6 weeks total   - will follow up in 2-3 weeks for re-check  -- Edison Simon, PA-C [facility] 03/25/2019, 10:28 AM [phone] M-F: 7am - 4pm

## 2019-04-10 ENCOUNTER — Ambulatory Visit (INDEPENDENT_AMBULATORY_CARE_PROVIDER_SITE_OTHER): Payer: Self-pay | Admitting: Physician Assistant

## 2019-04-10 ENCOUNTER — Other Ambulatory Visit: Payer: Self-pay

## 2019-04-10 ENCOUNTER — Encounter: Payer: Self-pay | Admitting: Physician Assistant

## 2019-04-10 VITALS — BP 142/80 | HR 86 | Temp 97.8°F | Ht 67.0 in | Wt 200.0 lb

## 2019-04-10 DIAGNOSIS — Z9889 Other specified postprocedural states: Secondary | ICD-10-CM

## 2019-04-10 DIAGNOSIS — K439 Ventral hernia without obstruction or gangrene: Secondary | ICD-10-CM

## 2019-04-10 DIAGNOSIS — Z09 Encounter for follow-up examination after completed treatment for conditions other than malignant neoplasm: Secondary | ICD-10-CM

## 2019-04-10 MED ORDER — CYCLOBENZAPRINE HCL 5 MG PO TABS: 5.0000 mg | ORAL_TABLET | Freq: Three times a day (TID) | ORAL | 0 refills | Status: DC

## 2019-04-10 NOTE — Patient Instructions (Signed)
Return as needed.The patient is aware to call back for any questions or concerns.  

## 2019-04-10 NOTE — Progress Notes (Signed)
[facility] POST-OP OFFICE VISIT  04/10/2019  HPI: [name] is a 48 y.o. male 4 weeks s/p colostomy takedown and abdominal wall reconstruction with Dr Dahlia Byes.  Today, he reports that he continues to do well. His biggest complaint is intermittent spasm to the incision. He had been taking flexeril and this provided relief. No fever, chills, nausea, emesis, trouble with appetite, or bowel changes. Overall doing well.   Vital signs: BP (!) 142/80   Pulse 86   Temp 97.8 F (36.6 C) (Skin)   Ht 5\' 7"  (1.702 m)   Wt 200 lb (90.7 kg)   SpO2 98%   BMI 31.32 kg/m    Physical Exam: Constitutional: Well appearing male, NAD Abdomen: Soft, non-tender, non-distended Skin: Laparotomy and previous colostomy incisions are both well healed, no erythema or drainage.   Assessment/Plan: This is a 48 y.o. male 4 weeks s/p colostomy takedown and abdominal wall reconstruction   - Will give one final flexeril relief; he understands  - complete 6 weeks of lifting restrictions; gradually increase activity over next 2 weeks  - RTC PRN; he will call with questions or concerns  -- Edison Simon, PA-C [facility] 04/10/2019, 1:42 PM [phone] M-F: 7am - 4pm

## 2020-04-25 ENCOUNTER — Encounter (HOSPITAL_COMMUNITY): Payer: Self-pay

## 2020-04-25 ENCOUNTER — Emergency Department (HOSPITAL_COMMUNITY)
Admission: EM | Admit: 2020-04-25 | Discharge: 2020-04-26 | Disposition: A | Discharge status: Home / Self Care | Payer: Medicaid Other | Attending: Emergency Medicine | Admitting: Emergency Medicine

## 2020-04-25 DIAGNOSIS — R1010 Upper abdominal pain, unspecified: Secondary | ICD-10-CM | POA: Insufficient documentation

## 2020-04-25 DIAGNOSIS — K5900 Constipation, unspecified: Secondary | ICD-10-CM | POA: Insufficient documentation

## 2020-04-25 DIAGNOSIS — Z5321 Procedure and treatment not carried out due to patient leaving prior to being seen by health care provider: Secondary | ICD-10-CM | POA: Insufficient documentation

## 2020-04-25 LAB — COMPREHENSIVE METABOLIC PANEL
ALT: 10 U/L (ref 0–44)
AST: 22 U/L (ref 15–41)
Albumin: 3.9 g/dL (ref 3.5–5.0)
Alkaline Phosphatase: 62 U/L (ref 38–126)
Anion gap: 12 (ref 5–15)
BUN: 12 mg/dL (ref 6–20)
CO2: 22 mmol/L (ref 22–32)
Calcium: 9.4 mg/dL (ref 8.9–10.3)
Chloride: 103 mmol/L (ref 98–111)
Creatinine, Ser: 0.97 mg/dL (ref 0.61–1.24)
GFR, Estimated: >60 mL/min (ref >60)
Glucose, Bld: 109 mg/dL — ABNORMAL HIGH (ref 70–99)
Potassium: 4.3 mmol/L (ref 3.5–5.1)
Sodium: 137 mmol/L (ref 135–145)
Total Bilirubin: 0.4 mg/dL (ref 0.3–1.2)
Total Protein: 6.5 g/dL (ref 6.5–8.1)

## 2020-04-25 LAB — CBC
HCT: 45.8 % (ref 39.0–52.0)
Hemoglobin: 14.2 g/dL (ref 13.0–17.0)
MCH: 25.9 pg — ABNORMAL LOW (ref 26.0–34.0)
MCHC: 31 g/dL (ref 30.0–36.0)
MCV: 83.6 fL (ref 80.0–100.0)
Platelets: 295 10*3/uL (ref 150–400)
RBC: 5.48 MIL/uL (ref 4.22–5.81)
RDW: 14.1 % (ref 11.5–15.5)
WBC: 9.6 10*3/uL (ref 4.0–10.5)
nRBC: 0 % (ref 0.0–0.2)

## 2020-04-25 LAB — LIPASE, BLOOD: Lipase: 27 U/L (ref 11–51)

## 2020-04-25 NOTE — ED Triage Notes (Signed)
Onset 1 month upper mid upper abd pain.  Nothing makes worse and nothing makes better.  Pt had a colostomy take down Sept 2020 and was having "spasms to the upper incision area". Pt has not had a follow-up since that appt.  Pt had constipation last week, drank laxative and had BM.

## 2020-04-26 NOTE — ED Notes (Signed)
No answer

## 2020-04-26 NOTE — ED Notes (Signed)
Pt not responding to vital check.

## 2020-05-10 ENCOUNTER — Encounter: Payer: Self-pay | Admitting: Family Medicine

## 2020-05-10 ENCOUNTER — Ambulatory Visit: Payer: Self-pay | Attending: Family Medicine | Admitting: Family Medicine

## 2020-05-10 ENCOUNTER — Other Ambulatory Visit: Payer: Self-pay

## 2020-05-10 ENCOUNTER — Other Ambulatory Visit: Payer: Self-pay | Admitting: Family Medicine

## 2020-05-10 VITALS — BP 128/78 | HR 69 | Temp 98.2°F | Ht 68.0 in | Wt 185.6 lb

## 2020-05-10 DIAGNOSIS — R110 Nausea: Secondary | ICD-10-CM

## 2020-05-10 DIAGNOSIS — F4310 Post-traumatic stress disorder, unspecified: Secondary | ICD-10-CM

## 2020-05-10 DIAGNOSIS — R1013 Epigastric pain: Secondary | ICD-10-CM

## 2020-05-10 DIAGNOSIS — R42 Dizziness and giddiness: Secondary | ICD-10-CM

## 2020-05-10 DIAGNOSIS — R1012 Left upper quadrant pain: Secondary | ICD-10-CM

## 2020-05-10 DIAGNOSIS — M545 Low back pain, unspecified: Secondary | ICD-10-CM

## 2020-05-10 DIAGNOSIS — G8929 Other chronic pain: Secondary | ICD-10-CM

## 2020-05-10 DIAGNOSIS — Z87828 Personal history of other (healed) physical injury and trauma: Secondary | ICD-10-CM

## 2020-05-10 MED ORDER — ONDANSETRON HCL 4 MG PO TABS: 4.0000 mg | ORAL_TABLET | Freq: Three times a day (TID) | ORAL | 0 refills | Status: DC | PRN

## 2020-05-10 MED ORDER — CYCLOBENZAPRINE HCL 5 MG PO TABS: 5.0000 mg | ORAL_TABLET | Freq: Three times a day (TID) | ORAL | 3 refills | Status: DC

## 2020-05-10 MED ORDER — OMEPRAZOLE 20 MG PO CPDR: 20.0000 mg | DELAYED_RELEASE_CAPSULE | Freq: Two times a day (BID) | ORAL | 3 refills | Status: DC

## 2020-05-10 MED ORDER — FLUOXETINE HCL 10 MG PO CAPS: 10.0000 mg | ORAL_CAPSULE | Freq: Two times a day (BID) | ORAL | 3 refills | Status: DC

## 2020-05-10 MED FILL — OMEPRAZOLE 20 MG CAP: 20 | 30 days supply | Qty: 60 | Fill #0

## 2020-05-10 MED FILL — CYCLOBENZAPRINE 5 MG TABLET: 5 | 30 days supply | Qty: 90 | Fill #0

## 2020-05-10 MED FILL — ONDANSETRON HCL 4 MG TABLET: 4 | 6 days supply | Qty: 20 | Fill #0

## 2020-05-10 NOTE — Progress Notes (Signed)
Has knot in midsection of stomach Spasms throughout back and midsection area  NEEDS REFILLS FOR PROZAC, FLEXERIL, FAMOTIDINE
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
## 2020-05-11 ENCOUNTER — Encounter: Payer: Self-pay | Admitting: Gastroenterology

## 2020-05-12 LAB — H PYLORI, IGM, IGG, IGA AB
H pylori, IgM Abs: <9 U (ref 0.0–8.9)
H. pylori, IgA Abs: <9 U (ref 0.0–8.9)
H. pylori, IgG AbS: 0.5 {index_val} (ref 0.00–0.79)

## 2020-05-12 MED FILL — FLUoxetine HCL 10 MG CAPS: 10 | 30 days supply | Qty: 60 | Fill #0

## 2020-05-21 ENCOUNTER — Telehealth: Payer: Self-pay | Admitting: Licensed Clinical Social Worker
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
## 2020-06-09 ENCOUNTER — Ambulatory Visit: Payer: Self-pay

## 2020-06-09 ENCOUNTER — Telehealth: Payer: Self-pay | Admitting: Licensed Clinical Social Worker

## 2020-06-09 NOTE — Telephone Encounter (Signed)
Pt reports "I'm feeling a little better since I called earlier" Pt reports that he was able to take his last prozac pill today which assisted in decreasing symptoms. He plans on picking up new prescription next week.   Pt denies any current suicidal ideations with plan or intent. States that wife and children are strong protective factors.   LCSW discussed supportive crisis intervention resources, should symptoms increase or SI occur. Contact information for the [facility] was provided via e-mail, per patient request. He has an upcoming appointment with LCSW for 06/16/20. No additional concerns noted.

## 2020-06-09 NOTE — Telephone Encounter (Signed)
  Reason for Disposition . [1] Depression AND [2] worsening (e.g.,sleeping poorly, less able to do activities of daily living)  Protocols used: DEPRESSION-A-AH

## 2020-06-09 NOTE — Telephone Encounter (Signed)
Jasmine, can you please reach out to this patient and refer to crises center if indicated?  Thank you

## 2020-06-09 NOTE — Telephone Encounter (Signed)
Patient called and says that he doesn't feel right. He says he's feeling anxious, having thoughts in his head to give up, voices telling him he's worthless. He says he has thoughts of suicide, but no active plan. He says he doesn't want to be this way, he is crying on the phone. I asked what is he doing right now and is he alone. He says his wife is in the house that he is in the back yard raking leaves trying to ease his mind. He says he's been feeling this way for about 4 days. He says he wakes up in the night with those thoughts and feels something is wrong in his body. He says he hasn't been on any medicine because he couldn't afford to pick it up, his prozac he says. He says he has stress, had a close cousin to die suddenly and this is what caused everything to go downhill after the death. I asked has he talked to anyone about this, he says no that he doesn't have a counselor and has not talked to his wife. At this time his wife comes outside and asked him what is wrong with him and who is he talking to. He started telling her about the above. She told him that he can not give up because he has too many people around him that will support him. She told him if he goes to the ED that he will not come back home because they will put him in the mental hospital with what he's saying, but she will take him if he wants to go. I advised he does need to go be evaluated, but he says he will talk to her, then go pick up his medication tomorrow. He says he just needed someone to talk to about what is going on that he's starting to feel better now. I advised to go to the ED if he starts having those thoughts again because they have professional help the he needs. He says he will go if needed, but he insists he will be ok. He says he will come to the office on 06/18/20 and talk to the doctor at that time about everything. I encouraged him to pick up his medication and start taking it and to follow up on 06/18/20 and go to the  ED if needed. He verbalized understanding.  Answer Assessment - Initial Assessment Questions 1. CONCERN: "What happened that made you call today?"     Hearing voices to give up, I'm worthless 2. ANXIETY SYMPTOM SCREENING: "Can you describe how you have been feeling?"  (e.g., tense, restless, panicky, anxious, keyed up, trouble sleeping, trouble concentrating)     Anxious 3. ONSET: "How long have you been feeling this way?"     4 days 4. RECURRENT: "Have you felt this way before?"  If Yes, ask: "What happened that time?" "What helped these feelings go away in the past?"      Yes, had medication for it 5. RISK OF HARM - SUICIDAL IDEATION:  "Do you ever have thoughts of hurting or killing yourself?"  (e.g., yes, no, no but preoccupation with thoughts about death)   - INTENT:  "Do you have thoughts of hurting or killing yourself right NOW?" (e.g., yes, no, N/A)   - PLAN: "Do you have a specific plan for how you would do this?" (e.g., gun, knife, overdose, no plan, N/A)     No plan, but thoughts 6. RISK OF HARM - HOMICIDAL IDEATION:  "Do  you ever have thoughts of hurting or killing someone else?"  (e.g., yes, no, no but preoccupation with thoughts about death)   - INTENT:  "Do you have thoughts of hurting or killing someone right NOW?" (e.g., yes, no, N/A)   - PLAN: "Do you have a specific plan for how you would do this?" (e.g., gun, knife, no plan, N/A)      Yes 7. FUNCTIONAL IMPAIRMENT: "How have things been going for you overall? Have you had more difficulty than usual doing your normal daily activities?"  (e.g., better, same, worse; self-care, school, work, interactions)     More stress, no job 8. SUPPORT: "Who is with you now?" "Who do you live with?" "Do you have family or friends who you can talk to?"      Wife is here 36. THERAPIST: "Do you have a counselor or therapist? Name?"     No 10. STRESSORS: "Has there been any new stress or recent changes in your life?"      Close cousin got  killed recently, then things started going downhill 11. CAFFEINE USE: "Do you drink caffeinated beverages, and how much each day?" (e.g., coffee, tea, colas)       Yes 12. ALCOHOL USE OR SUBSTANCE USE (DRUG USE): "Do you drink alcohol or use any illegal drugs?"       No 13. OTHER SYMPTOMS: "Do you have any other physical symptoms right now?" (e.g., chest pain, palpitations, difficulty breathing, fever)       No 14. PREGNANCY: "Is there any chance you are pregnant?" "When was your last menstrual period?"       N/A  Protocols used: ANXIETY AND PANIC ATTACK-A-AH

## 2020-06-16 ENCOUNTER — Ambulatory Visit: Payer: Medicare Other | Attending: Family Medicine | Admitting: Licensed Clinical Social Worker

## 2020-06-16 ENCOUNTER — Other Ambulatory Visit: Payer: Self-pay

## 2020-06-16 DIAGNOSIS — F419 Anxiety disorder, unspecified: Secondary | ICD-10-CM

## 2020-06-16 DIAGNOSIS — F32A Depression, unspecified: Secondary | ICD-10-CM | POA: Insufficient documentation

## 2020-06-16 DIAGNOSIS — F4310 Post-traumatic stress disorder, unspecified: Secondary | ICD-10-CM
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
## 2020-06-18 ENCOUNTER — Ambulatory Visit: Payer: Medicaid Other | Admitting: Gastroenterology

## 2020-06-22 ENCOUNTER — Encounter: Payer: Self-pay | Admitting: Family Medicine

## 2020-06-25 MED FILL — CYCLOBENZAPRINE 5 MG TABLET: 5 | 30 days supply | Qty: 90 | Fill #1

## 2020-06-25 MED FILL — OMEPRAZOLE 20 MG CAP: 20 | 30 days supply | Qty: 60 | Fill #1

## 2020-06-25 MED FILL — FLUoxetine HCL 10 MG CAPS: 10 | 30 days supply | Qty: 60 | Fill #0

## 2020-06-28 ENCOUNTER — Institutional Professional Consult (permissible substitution): Payer: Medicare Other | Admitting: Licensed Clinical Social Worker

## 2020-07-08 ENCOUNTER — Ambulatory Visit: Payer: Medicare Other | Attending: Family Medicine | Admitting: Licensed Clinical Social Worker

## 2020-07-08 ENCOUNTER — Other Ambulatory Visit: Payer: Self-pay

## 2020-08-17 MED FILL — FLUoxetine HCL 10 MG CAPS: 10 | 30 days supply | Qty: 60 | Fill #1

## 2020-08-17 MED FILL — CYCLOBENZAPRINE 5 MG TABLET: 5 | 30 days supply | Qty: 90 | Fill #2

## 2020-10-05 MED FILL — CYCLOBENZAPRINE 5 MG TABLET: 5 | 30 days supply | Qty: 90 | Fill #3

## 2020-10-05 MED FILL — FLUoxetine HCL 10 MG CAPS: 10 | 30 days supply | Qty: 60 | Fill #2

## 2020-11-30 ENCOUNTER — Telehealth: Payer: Self-pay | Admitting: Family Medicine

## 2020-11-30 DIAGNOSIS — G8929 Other chronic pain: Secondary | ICD-10-CM

## 2020-11-30 DIAGNOSIS — F4310 Post-traumatic stress disorder, unspecified: Secondary | ICD-10-CM

## 2020-11-30 DIAGNOSIS — M545 Low back pain, unspecified: Secondary | ICD-10-CM

## 2020-11-30 NOTE — Telephone Encounter (Signed)
Medication Refill - Medication: FLUoxetine (PROZAC) 10 MG capsule cyclobenzaprine (FLEXERIL) 5 MG tablet      Preferred Pharmacy (with phone number or street name):  [facility], [address] Phone:  [phone]  Fax:  [phone]       Agent: Please be advised that RX refills may take up to 3 business days. We ask that you follow-up with your pharmacy.
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
## 2020-12-02 MED ORDER — FLUOXETINE HCL 10 MG PO CAPS: ORAL_CAPSULE | ORAL | 0 refills | Status: DC

## 2020-12-02 NOTE — Addendum Note (Signed)
Addended by: Daisy Blossom, Annie Main L on: 12/02/2020 05:10 PM   Modules accepted: Orders

## 2020-12-02 NOTE — Telephone Encounter (Signed)
Sent fluoxetine. I recommend to hold off on the muscle relaxer until patient is seen.

## 2020-12-02 NOTE — Telephone Encounter (Signed)
Pt scheduled next available appt in June, requesting enough medication to last him until then

## 2020-12-02 NOTE — Telephone Encounter (Signed)
Can this be filled ?

## 2021-01-06 ENCOUNTER — Other Ambulatory Visit: Payer: Self-pay

## 2021-01-06 ENCOUNTER — Ambulatory Visit: Payer: Medicare Other | Attending: Physician Assistant | Admitting: Physician Assistant

## 2021-01-06 ENCOUNTER — Ambulatory Visit: Payer: Self-pay

## 2021-01-06 VITALS — BP 125/74 | HR 75 | Ht 68.0 in | Wt 195.2 lb

## 2021-01-06 DIAGNOSIS — R110 Nausea: Secondary | ICD-10-CM | POA: Diagnosis not present

## 2021-01-06 DIAGNOSIS — M545 Low back pain, unspecified: Secondary | ICD-10-CM

## 2021-01-06 DIAGNOSIS — F4310 Post-traumatic stress disorder, unspecified: Secondary | ICD-10-CM | POA: Diagnosis not present

## 2021-01-06 DIAGNOSIS — Z87828 Personal history of other (healed) physical injury and trauma: Secondary | ICD-10-CM

## 2021-01-06 DIAGNOSIS — R1013 Epigastric pain: Secondary | ICD-10-CM | POA: Diagnosis not present

## 2021-01-06 DIAGNOSIS — D171 Benign lipomatous neoplasm of skin and subcutaneous tissue of trunk: Secondary | ICD-10-CM

## 2021-01-06 DIAGNOSIS — S31139S Puncture wound of abdominal wall without foreign body, unspecified quadrant without penetration into peritoneal cavity, sequela: Secondary | ICD-10-CM

## 2021-01-06 DIAGNOSIS — G8929 Other chronic pain: Secondary | ICD-10-CM | POA: Diagnosis not present

## 2021-01-06 DIAGNOSIS — K0889 Other specified disorders of teeth and supporting structures: Secondary | ICD-10-CM

## 2021-01-06 MED ORDER — OMEPRAZOLE 20 MG PO CPDR
DELAYED_RELEASE_CAPSULE | ORAL | 3 refills | Status: DC
  Filled 2021-01-06: qty 60, 30d supply, fill #0
  Filled 2021-04-08: qty 60, 30d supply, fill #1

## 2021-01-06 MED ORDER — TRAMADOL HCL 50 MG PO TABS
50.0000 mg | ORAL_TABLET | Freq: Three times a day (TID) | ORAL | 0 refills | Status: AC | PRN
  Filled 2021-01-06: qty 15, 5d supply, fill #0

## 2021-01-06 MED ORDER — FLUOXETINE HCL 10 MG PO CAPS
ORAL_CAPSULE | ORAL | 2 refills | Status: DC
  Filled 2021-01-06: qty 60, 30d supply, fill #0
  Filled 2021-04-08: qty 60, 30d supply, fill #1

## 2021-01-06 MED ORDER — KETOROLAC TROMETHAMINE 60 MG/2ML IM SOLN
60.0000 mg | Freq: Once | INTRAMUSCULAR | Status: AC
  Administered 2021-01-06: 60 mg via INTRAMUSCULAR

## 2021-01-06 MED ORDER — CYCLOBENZAPRINE HCL 5 MG PO TABS
ORAL_TABLET | ORAL | 3 refills | Status: DC
  Filled 2021-01-06: qty 90, 30d supply, fill #0
  Filled 2021-04-08: qty 90, 30d supply, fill #1
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
## 2021-01-06 NOTE — Telephone Encounter (Signed)
Patient called to discuss his call on yesterday for abdominal pain and back pain. He says this is an ongoing issue that he's been dealing with since before surgery and after. He says he ran out of pain medicines and the only way he can have a refill is to be seen in the office. He says the abdominal pain is a 9, spasms, cramps and the back pain also spasms, chronic issue as well. He denies additional symptoms. Appointment scheduled for today at 1550 with Freeman Caldron, PA-C, care advice given, patient verbalized understanding.    Message from [place] sent at 01/05/2021  4:23 PM EDT  Patient calling about pain and spasms in his back and happened some while he was on the phone with pain. Please call back to advise      Reason for Disposition  Abdominal pain is a chronic symptom (recurrent or ongoing AND present > 4 weeks)  Answer Assessment - Initial Assessment Questions 1. LOCATION: "Where does it hurt?"      All over, moves around, spasms 2. RADIATION: "Does the pain shoot anywhere else?" (e.g., chest, back)     No 3. ONSET: "When did the pain begin?" (Minutes, hours or days ago)      Ongoing pain  4. SUDDEN: "Gradual or sudden onset?"     Some gradual and sometime sudden 5. PATTERN "Does the pain come and go, or is it constant?"    - If constant: "Is it getting better, staying the same, or worsening?"      (Note: Constant means the pain never goes away completely; most serious pain is constant and it progresses)     - If intermittent: "How long does it last?" "Do you have pain now?"     (Note: Intermittent means the pain goes away completely between bouts)     Constant pain 6. SEVERITY: "How bad is the pain?"  (e.g., Scale 1-10; mild, moderate, or severe)    - MILD (1-3): doesn't interfere with normal activities, abdomen soft and not tender to touch     - MODERATE (4-7): interferes with normal activities or awakens from sleep, abdomen tender to touch     - SEVERE (8-10):  excruciating pain, doubled over, unable to do any normal activities       9 7. RECURRENT SYMPTOM: "Have you ever had this type of stomach pain before?" If Yes, ask: "When was the last time?" and "What happened that time?"      Yes, ongoing since before and after surgery 8. CAUSE: "What do you think is causing the stomach pain?"     I don't know 9. RELIEVING/AGGRAVATING FACTORS: "What makes it better or worse?" (e.g., movement, antacids, bowel movement)     Worse-nothing helps the pain 10. OTHER SYMPTOMS: "Do you have any other symptoms?" (e.g., back pain, diarrhea, fever, urination pain, vomiting)       Back spasms/pain  Protocols used: Abdominal Pain - Male-A-AH

## 2021-01-06 NOTE — Progress Notes (Signed)
Pain in knee,back and abdomen.

## 2021-01-07 LAB — CBC WITH DIFFERENTIAL/PLATELET
Basophils Absolute: 0 10*3/uL (ref 0.0–0.2)
Basos: 1 %
EOS (ABSOLUTE): 0.1 10*3/uL (ref 0.0–0.4)
Eos: 1 %
Hematocrit: 46.5 % (ref 37.5–51.0)
Hemoglobin: 14.9 g/dL (ref 13.0–17.7)
Immature Grans (Abs): 0 10*3/uL (ref 0.0–0.1)
Immature Granulocytes: 1 %
Lymphocytes Absolute: 2.4 10*3/uL (ref 0.7–3.1)
Lymphs: 28 %
MCH: 26.7 pg (ref 26.6–33.0)
MCHC: 32 g/dL (ref 31.5–35.7)
MCV: 83 fL (ref 79–97)
Monocytes Absolute: 0.8 10*3/uL (ref 0.1–0.9)
Monocytes: 10 %
Neutrophils Absolute: 5.2 10*3/uL (ref 1.4–7.0)
Neutrophils: 59 %
Platelets: 316 10*3/uL (ref 150–450)
RBC: 5.58 x10E6/uL (ref 4.14–5.80)
RDW: 13 % (ref 11.6–15.4)
WBC: 8.5 10*3/uL (ref 3.4–10.8)

## 2021-01-07 LAB — COMPREHENSIVE METABOLIC PANEL
ALT: 11 IU/L (ref 0–44)
AST: 23 IU/L (ref 0–40)
Albumin/Globulin Ratio: 2.4 — ABNORMAL HIGH (ref 1.2–2.2)
Albumin: 5.2 g/dL — ABNORMAL HIGH (ref 4.0–5.0)
Alkaline Phosphatase: 72 IU/L (ref 44–121)
BUN/Creatinine Ratio: 15 (ref 9–20)
BUN: 16 mg/dL (ref 6–24)
Bilirubin Total: 0.3 mg/dL (ref 0.0–1.2)
CO2: 25 mmol/L (ref 20–29)
Calcium: 10.1 mg/dL (ref 8.7–10.2)
Chloride: 100 mmol/L (ref 96–106)
Creatinine, Ser: 1.04 mg/dL (ref 0.76–1.27)
Globulin, Total: 2.2 g/dL (ref 1.5–4.5)
Glucose: 94 mg/dL (ref 65–99)
Potassium: 4.8 mmol/L (ref 3.5–5.2)
Sodium: 137 mmol/L (ref 134–144)
Total Protein: 7.4 g/dL (ref 6.0–8.5)
eGFR: 89 mL/min/{1.73_m2} (ref >59)

## 2021-01-11 ENCOUNTER — Encounter (INDEPENDENT_AMBULATORY_CARE_PROVIDER_SITE_OTHER): Payer: Self-pay | Admitting: *Deleted

## 2021-01-13 ENCOUNTER — Encounter (INDEPENDENT_AMBULATORY_CARE_PROVIDER_SITE_OTHER): Payer: Self-pay | Admitting: *Deleted

## 2021-03-08 ENCOUNTER — Other Ambulatory Visit: Payer: Self-pay

## 2021-03-08 ENCOUNTER — Ambulatory Visit: Payer: Medicare Other | Attending: Critical Care Medicine | Admitting: Critical Care Medicine

## 2021-04-08 ENCOUNTER — Other Ambulatory Visit: Payer: Self-pay

## 2021-05-23 ENCOUNTER — Other Ambulatory Visit: Payer: Self-pay | Admitting: Critical Care Medicine

## 2021-05-23 DIAGNOSIS — F4310 Post-traumatic stress disorder, unspecified: Secondary | ICD-10-CM

## 2021-05-23 DIAGNOSIS — M545 Low back pain, unspecified: Secondary | ICD-10-CM

## 2021-05-23 DIAGNOSIS — R1013 Epigastric pain: Secondary | ICD-10-CM

## 2021-05-23 DIAGNOSIS — G8929 Other chronic pain: Secondary | ICD-10-CM

## 2021-05-23 DIAGNOSIS — R110 Nausea: Secondary | ICD-10-CM

## 2021-05-23 MED ORDER — FLUOXETINE HCL 10 MG PO CAPS: ORAL_CAPSULE | ORAL | 1 refills | Status: DC

## 2021-05-23 MED ORDER — OMEPRAZOLE 20 MG PO CPDR: DELAYED_RELEASE_CAPSULE | ORAL | 1 refills | Status: DC

## 2021-05-23 NOTE — Telephone Encounter (Signed)
Medication Refill - Medication:  FLUoxetine (PROZAC) 10 MG capsule cyclobenzaprine (FLEXERIL) 5 MG tablet  omeprazole (PRILOSEC) 20 MG capsule   Has the patient contacted their pharmacy? Yes.   Contact PCP, make appt  Preferred Pharmacy (with phone number or street name):  [facility], [place] Phone:  [phone]  Fax:  [phone]     Has the patient been seen for an appointment in the last year OR does the patient have an upcoming appointment? Yes.  Pt does have appt 07/12/21 8:30am  Agent: Please be advised that RX refills may take up to 3 business days. We ask that you follow-up with your pharmacy.
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
## 2021-05-24 MED ORDER — CYCLOBENZAPRINE HCL 5 MG PO TABS: ORAL_TABLET | ORAL | 0 refills | Status: DC

## 2021-06-24 ENCOUNTER — Telehealth: Payer: Self-pay | Admitting: Family Medicine

## 2021-06-24 NOTE — Telephone Encounter (Signed)
Dr. Joya Gaskins on vacation 12/27. Left patient vm to call [phone] to reschedule appt.
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
## 2021-07-07 ENCOUNTER — Other Ambulatory Visit: Payer: Self-pay

## 2021-07-07 DIAGNOSIS — F4310 Post-traumatic stress disorder, unspecified: Secondary | ICD-10-CM

## 2021-07-07 MED ORDER — DICLOFENAC SODIUM 1 % EX GEL: 2.0000 g | Freq: Four times a day (QID) | CUTANEOUS | 0 refills | Status: DC
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
## 2021-07-07 NOTE — Telephone Encounter (Signed)
Pt stated he has no more medication.

## 2021-07-07 NOTE — Telephone Encounter (Signed)
Medication Refill - Medication: FLUoxetine (PROZAC) 10 MG capsule , traMADol (ULTRAM) 50 MG tablet , diclofenac sodium topical gel 1% (Unable to locate in pt chart)   Has the patient contacted their pharmacy? No. Pt stated he knows he has no more refills.   (Agent: If no, request that the patient contact the pharmacy for the refill. If patient does not wish to contact the pharmacy document the reason why and proceed with request.)   Preferred Pharmacy (with phone number or street name):   [place], [place]  [address]  [place] [address]  Phone: [phone] Fax: [phone]  Hours: Not open 24 hours   Has the patient been seen for an appointment in the last year OR does the patient have an upcoming appointment? Yes.    Agent: Please be advised that RX refills may take up to 3 business days. We ask that you follow-up with your pharmacy.

## 2021-07-08 ENCOUNTER — Ambulatory Visit: Payer: Self-pay | Admitting: *Deleted

## 2021-07-08 ENCOUNTER — Telehealth: Payer: Self-pay | Admitting: Critical Care Medicine

## 2021-07-08 DIAGNOSIS — G8929 Other chronic pain: Secondary | ICD-10-CM

## 2021-07-08 NOTE — Telephone Encounter (Signed)
Copied from [place] [id number]. Topic: Quick Communication - Rx Refill/Question >> Jul 08, 2021  2:51 PM Yvette Rack wrote: Medication: traMADol (ULTRAM) 50 MG tablet  Has the patient contacted their pharmacy? Yes.  Pt stated the Rx fromn August is no longer valid (Agent: If no, request that the patient contact the pharmacy for the refill. If patient does not wish to contact the pharmacy document the reason why and proceed with request.) (Agent: If yes, when and what did the pharmacy advise?)  Preferred Pharmacy (with phone number or street name): [place], [place] Phone: [phone] Fax: [phone]  Has the patient been seen for an appointment in the last year OR does the patient have an upcoming appointment? Yes.    Agent: Please be advised that RX refills may take up to 3 business days. We ask that you follow-up with your pharmacy.

## 2021-07-08 NOTE — Telephone Encounter (Signed)
Summary: medication question   Pt stated he is having lots of pain in his back and he would like to discuss with a nurse possible remedies or otc medications he can take to help ease the pain. Cb# [phone]      Reason for Disposition  [1] SEVERE back pain (e.g., excruciating, unable to do any normal activities) AND [2] not improved 2 hours after pain medicine  Answer Assessment - Initial Assessment Questions 1. ONSET: "When did the pain begin?"      1 month 2. LOCATION: "Where does it hurt?" (upper, mid or lower back)     Lower back pain- R side lower pelvis 3. SEVERITY: "How bad is the pain?"  (e.g., Scale 1-10; mild, moderate, or severe)   - MILD (1-3): doesn't interfere with normal activities    - MODERATE (4-7): interferes with normal activities or awakens from sleep    - SEVERE (8-10): excruciating pain, unable to do any normal activities      moderate 4. PATTERN: "Is the pain constant?" (e.g., yes, no; constant, intermittent)      Comes and goes- lasting longer then normal 5. RADIATION: "Does the pain shoot into your legs or elsewhere?"     Runs into left side and then to right side of legs 6. CAUSE:  "What do you think is causing the back pain?"      Previous injury 7. BACK OVERUSE:  "Any recent lifting of heavy objects, strenuous work or exercise?"     Patient did some heavy lifting 8. MEDICATIONS: "What have you taken so far for the pain?" (e.g., nothing, acetaminophen, NSAIDS)     Patient had Tramadol- but is out, motrin, tylenol 9. NEUROLOGIC SYMPTOMS: "Do you have any weakness, numbness, or problems with bowel/bladder control?"     Weakness at times walking 10. OTHER SYMPTOMS: "Do you have any other symptoms?" (e.g., fever, abdominal pain, burning with urination, blood in urine)       Recent fall hurt reconstructed leg 11. PREGNANCY: "Is there any chance you are pregnant?" (e.g., yes, no; LMP)       na  Protocols used: Back Pain-A-AH
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
## 2021-07-12 ENCOUNTER — Ambulatory Visit: Payer: Medicare Other | Admitting: Critical Care Medicine

## 2021-07-13 NOTE — Telephone Encounter (Signed)
I have not seen this pt and see that he is on my 1/31 schedule.  I cannot rx opioids without seeing this patient

## 2021-07-15 ENCOUNTER — Other Ambulatory Visit: Payer: Self-pay | Admitting: Critical Care Medicine

## 2021-07-15 ENCOUNTER — Other Ambulatory Visit: Payer: Self-pay | Admitting: Family Medicine

## 2021-07-15 DIAGNOSIS — G8929 Other chronic pain: Secondary | ICD-10-CM

## 2021-07-15 DIAGNOSIS — M545 Low back pain, unspecified: Secondary | ICD-10-CM
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
## 2021-07-15 NOTE — Telephone Encounter (Signed)
Please review for refill. Duplicate request.

## 2021-07-15 NOTE — Telephone Encounter (Signed)
Medication Refill - Medication: cyclobenzaprine (FLEXERIL) 5 MG tablet  Has the patient contacted their pharmacy? Yes.    (Agent: If yes, when and what did the pharmacy advise?)  Preferred Pharmacy (with phone number or street name):  [facility], [place] Phone:  [phone]  Fax:  [phone]      Has the patient been seen for an appointment in the last year OR does the patient have an upcoming appointment? Yes.    Agent: Please be advised that RX refills may take up to 3 business days. We ask that you follow-up with your pharmacy.

## 2021-07-16 MED ORDER — CYCLOBENZAPRINE HCL 5 MG PO TABS: ORAL_TABLET | ORAL | 0 refills | Status: DC

## 2021-07-24 ENCOUNTER — Other Ambulatory Visit: Payer: Self-pay | Admitting: Critical Care Medicine

## 2021-07-24 ENCOUNTER — Other Ambulatory Visit: Payer: Self-pay | Admitting: Family Medicine

## 2021-07-24 DIAGNOSIS — F4310 Post-traumatic stress disorder, unspecified: Secondary | ICD-10-CM

## 2021-07-24 DIAGNOSIS — R1013 Epigastric pain: Secondary | ICD-10-CM

## 2021-07-24 DIAGNOSIS — R110 Nausea: Secondary | ICD-10-CM

## 2021-07-24 DIAGNOSIS — M545 Low back pain, unspecified: Secondary | ICD-10-CM

## 2021-07-24 DIAGNOSIS — G8929 Other chronic pain: Secondary | ICD-10-CM
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
## 2021-08-16 ENCOUNTER — Ambulatory Visit: Payer: Medicare Other | Admitting: Critical Care Medicine
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
## 2021-10-03 ENCOUNTER — Ambulatory Visit: Payer: Medicare Other | Admitting: Critical Care Medicine

## 2021-12-19 ENCOUNTER — Other Ambulatory Visit: Payer: Self-pay | Admitting: Family Medicine

## 2021-12-19 DIAGNOSIS — G8929 Other chronic pain: Secondary | ICD-10-CM

## 2022-02-14 NOTE — Progress Notes (Unsigned)
   Established Patient Office Visit  Subjective   Patient ID: [name], male    DOB: 05-May-1971  Age: 51 y.o. MRN: [id number] Virtual Visit via Video Note  I connected [email or web address] on 02/14/22 [email or web address] by a video enabled telemedicine application and verified that I am speaking with the correct person using two identifiers.   Consent:  I discussed the limitations, risks, security and privacy concerns of performing an evaluation and management service by video visit and the availability of in person appointments. I also discussed with the patient that there may be a patient responsible charge related to this service. The patient expressed understanding and agreed to proceed.  Location of patient:  Location of provider:  Persons participating in the televisit with the patient.       History of Present Illness:     No chief complaint on file.   HPI  {History (Optional):23778}  ROS    Objective:     There were no vitals taken for this visit. {Vitals History (Optional):23777}  Physical Exam   No results found for any visits on 02/15/22.  {Labs (Optional):23779}  The ASCVD Risk score (Arnett DK, et al., 2019) failed to calculate for the following reasons:   The systolic blood pressure is missing   Cannot find a previous HDL lab   Cannot find a previous total cholesterol lab    Assessment & Plan:   Problem List Items Addressed This Visit   None  Follow Up Instructions:    I discussed the assessment and treatment plan with the patient. The patient was provided an opportunity to ask questions and all were answered. The patient agreed with the plan and demonstrated an understanding of the instructions.   The patient was advised to call back or seek an in-person evaluation if the symptoms worsen or if the condition fails to improve as anticipated.  I provided *** minutes of non-face-to-face time during this encounter  including  median intraservice time , review  of notes, labs, imaging, medications  and explaining diagnosis and management to the patient .    Asencion Noble, MD

## 2022-02-15 ENCOUNTER — Telehealth (HOSPITAL_BASED_OUTPATIENT_CLINIC_OR_DEPARTMENT_OTHER): Payer: Medicare Other | Admitting: Critical Care Medicine

## 2022-02-15 ENCOUNTER — Ambulatory Visit: Payer: Medicare Other | Admitting: Critical Care Medicine

## 2022-02-15 ENCOUNTER — Encounter: Payer: Self-pay | Admitting: Critical Care Medicine

## 2022-02-15 DIAGNOSIS — F4310 Post-traumatic stress disorder, unspecified: Secondary | ICD-10-CM

## 2022-02-15 DIAGNOSIS — R339 Retention of urine, unspecified: Secondary | ICD-10-CM

## 2022-02-15 DIAGNOSIS — F419 Anxiety disorder, unspecified: Secondary | ICD-10-CM

## 2022-02-15 DIAGNOSIS — G8929 Other chronic pain: Secondary | ICD-10-CM

## 2022-02-15 DIAGNOSIS — F32A Depression, unspecified: Secondary | ICD-10-CM

## 2022-02-15 DIAGNOSIS — M545 Low back pain, unspecified: Secondary | ICD-10-CM

## 2022-02-15 DIAGNOSIS — Z87898 Personal history of other specified conditions: Secondary | ICD-10-CM

## 2022-02-15 DIAGNOSIS — G894 Chronic pain syndrome: Secondary | ICD-10-CM

## 2022-02-15 DIAGNOSIS — R1013 Epigastric pain: Secondary | ICD-10-CM | POA: Diagnosis not present

## 2022-02-15 DIAGNOSIS — Z720 Tobacco use: Secondary | ICD-10-CM

## 2022-02-15 DIAGNOSIS — R110 Nausea: Secondary | ICD-10-CM

## 2022-02-15 DIAGNOSIS — K5901 Slow transit constipation: Secondary | ICD-10-CM

## 2022-02-15 DIAGNOSIS — I824Z1 Acute embolism and thrombosis of unspecified deep veins of right distal lower extremity: Secondary | ICD-10-CM

## 2022-02-15 MED ORDER — NAPROXEN 500 MG PO TABS: 500.0000 mg | ORAL_TABLET | Freq: Two times a day (BID) | ORAL | 1 refills | Status: DC

## 2022-02-15 MED ORDER — OMEPRAZOLE 20 MG PO CPDR: 20.0000 mg | DELAYED_RELEASE_CAPSULE | Freq: Two times a day (BID) | ORAL | 0 refills | Status: DC

## 2022-02-15 MED ORDER — DICLOFENAC SODIUM 1 % EX GEL: CUTANEOUS | 0 refills | Status: DC

## 2022-02-15 MED ORDER — FLUOXETINE HCL 10 MG PO CAPS: ORAL_CAPSULE | ORAL | 0 refills | Status: DC

## 2022-02-15 MED ORDER — CYCLOBENZAPRINE HCL 5 MG PO TABS: 5.0000 mg | ORAL_TABLET | Freq: Three times a day (TID) | ORAL | 0 refills | Status: DC | PRN

## 2022-02-15 NOTE — Assessment & Plan Note (Signed)
We will assess at the next visit

## 2022-02-15 NOTE — Assessment & Plan Note (Signed)
This has resolved.

## 2022-02-15 NOTE — Assessment & Plan Note (Signed)
Tolerates Prozac well we will refill

## 2022-02-15 NOTE — Assessment & Plan Note (Signed)
Resume Prozac

## 2022-02-15 NOTE — Assessment & Plan Note (Signed)
Still having some constipation does use over-the-counter medication

## 2022-02-15 NOTE — Assessment & Plan Note (Signed)
Patient no longer drinking alcohol
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
## 2022-02-15 NOTE — Assessment & Plan Note (Signed)
Chronic orthopedic low back pain will need to assess face-to-face in the interim we will prescribe Naprosyn topical Voltaren gel and cyclobenzaprine

## 2022-02-28 ENCOUNTER — Encounter: Payer: Self-pay | Admitting: Critical Care Medicine

## 2022-02-28 ENCOUNTER — Ambulatory Visit: Payer: Medicare Other | Attending: Critical Care Medicine | Admitting: Critical Care Medicine

## 2022-02-28 VITALS — BP 131/83 | HR 66 | Temp 98.2°F | Resp 14 | Ht 67.0 in | Wt 213.0 lb

## 2022-02-28 DIAGNOSIS — F17200 Nicotine dependence, unspecified, uncomplicated: Secondary | ICD-10-CM

## 2022-02-28 DIAGNOSIS — M25512 Pain in left shoulder: Secondary | ICD-10-CM | POA: Insufficient documentation

## 2022-02-28 DIAGNOSIS — K5900 Constipation, unspecified: Secondary | ICD-10-CM | POA: Diagnosis not present

## 2022-02-28 DIAGNOSIS — F1290 Cannabis use, unspecified, uncomplicated: Secondary | ICD-10-CM | POA: Diagnosis not present

## 2022-02-28 DIAGNOSIS — G894 Chronic pain syndrome: Secondary | ICD-10-CM | POA: Insufficient documentation

## 2022-02-28 DIAGNOSIS — M5416 Radiculopathy, lumbar region: Secondary | ICD-10-CM | POA: Insufficient documentation

## 2022-02-28 DIAGNOSIS — Z720 Tobacco use: Secondary | ICD-10-CM

## 2022-02-28 DIAGNOSIS — K625 Hemorrhage of anus and rectum: Secondary | ICD-10-CM | POA: Insufficient documentation

## 2022-02-28 DIAGNOSIS — F419 Anxiety disorder, unspecified: Secondary | ICD-10-CM

## 2022-02-28 DIAGNOSIS — K912 Postsurgical malabsorption, not elsewhere classified: Secondary | ICD-10-CM | POA: Diagnosis not present

## 2022-02-28 DIAGNOSIS — G8929 Other chronic pain: Secondary | ICD-10-CM

## 2022-02-28 DIAGNOSIS — F4310 Post-traumatic stress disorder, unspecified: Secondary | ICD-10-CM

## 2022-02-28 DIAGNOSIS — M545 Low back pain, unspecified: Secondary | ICD-10-CM | POA: Insufficient documentation

## 2022-02-28 DIAGNOSIS — Z6833 Body mass index (BMI) 33.0-33.9, adult: Secondary | ICD-10-CM

## 2022-02-28 DIAGNOSIS — Z9889 Other specified postprocedural states: Secondary | ICD-10-CM | POA: Diagnosis not present

## 2022-02-28 DIAGNOSIS — N6320 Unspecified lump in the left breast, unspecified quadrant: Secondary | ICD-10-CM | POA: Insufficient documentation

## 2022-02-28 DIAGNOSIS — K5901 Slow transit constipation: Secondary | ICD-10-CM | POA: Diagnosis not present

## 2022-02-28 DIAGNOSIS — Z933 Colostomy status: Secondary | ICD-10-CM | POA: Diagnosis not present

## 2022-02-28 DIAGNOSIS — R1013 Epigastric pain: Secondary | ICD-10-CM | POA: Insufficient documentation

## 2022-02-28 DIAGNOSIS — R0789 Other chest pain: Secondary | ICD-10-CM | POA: Insufficient documentation

## 2022-02-28 DIAGNOSIS — R42 Dizziness and giddiness: Secondary | ICD-10-CM | POA: Diagnosis not present

## 2022-02-28 DIAGNOSIS — R739 Hyperglycemia, unspecified: Secondary | ICD-10-CM

## 2022-02-28 DIAGNOSIS — Z87898 Personal history of other specified conditions: Secondary | ICD-10-CM

## 2022-02-28 DIAGNOSIS — N6311 Unspecified lump in the right breast, upper outer quadrant: Secondary | ICD-10-CM | POA: Diagnosis present

## 2022-02-28 DIAGNOSIS — F32A Depression, unspecified: Secondary | ICD-10-CM

## 2022-02-28 DIAGNOSIS — R110 Nausea: Secondary | ICD-10-CM

## 2022-02-28 DIAGNOSIS — Z139 Encounter for screening, unspecified: Secondary | ICD-10-CM

## 2022-02-28 DIAGNOSIS — Z1159 Encounter for screening for other viral diseases: Secondary | ICD-10-CM

## 2022-02-28 MED ORDER — TRAMADOL HCL 50 MG PO TABS: 50.0000 mg | ORAL_TABLET | Freq: Three times a day (TID) | ORAL | 0 refills | Status: AC | PRN

## 2022-02-28 MED ORDER — OMEPRAZOLE 20 MG PO CPDR: 20.0000 mg | DELAYED_RELEASE_CAPSULE | Freq: Two times a day (BID) | ORAL | 2 refills | Status: DC

## 2022-02-28 MED ORDER — CYCLOBENZAPRINE HCL 5 MG PO TABS: 5.0000 mg | ORAL_TABLET | Freq: Three times a day (TID) | ORAL | 2 refills | Status: DC | PRN

## 2022-02-28 MED ORDER — KETOROLAC TROMETHAMINE 60 MG/2ML IM SOLN
60.0000 mg | Freq: Once | INTRAMUSCULAR | Status: AC
  Administered 2022-02-28: 60 mg via INTRAMUSCULAR

## 2022-02-28 MED ORDER — NAPROXEN 500 MG PO TABS: 500.0000 mg | ORAL_TABLET | Freq: Two times a day (BID) | ORAL | 1 refills | Status: DC

## 2022-02-28 MED ORDER — DICLOFENAC SODIUM 1 % EX GEL: CUTANEOUS | 2 refills | Status: DC

## 2022-02-28 MED ORDER — FLUOXETINE HCL 10 MG PO CAPS: ORAL_CAPSULE | ORAL | 0 refills | Status: DC

## 2022-02-28 NOTE — Assessment & Plan Note (Signed)

## 2022-02-28 NOTE — Patient Instructions (Signed)
Rest imaging study of the right wrist will be obtained  Referrals to [facility] for pain management and gastroenterology is made  Refills on all medications sent to your [facility]  I did refill tramadol for 15 tablets and a drug screen will be obtained  Complete health screening labs were obtained  Follow-up lifestyle medicine handout review the exercise and diet sections in particular  Return to see Dr. Joya Gaskins 4 months

## 2022-02-28 NOTE — Assessment & Plan Note (Signed)
Continue with Prozac

## 2022-02-28 NOTE — Assessment & Plan Note (Signed)
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
## 2022-02-28 NOTE — Assessment & Plan Note (Signed)
Need to evaluate for breast cancer imaging will be obtained

## 2022-02-28 NOTE — Assessment & Plan Note (Signed)
Patient states he is no longer smoking but is using marijuana nightly

## 2022-02-28 NOTE — Assessment & Plan Note (Signed)
We will refill tramadol for 15 tablets PDMP checked no multiple prescribers urine drug screen and given patient be referred to pain management at [facility]

## 2022-02-28 NOTE — Assessment & Plan Note (Signed)
Not currently drinking alcohol 

## 2022-02-28 NOTE — Assessment & Plan Note (Signed)
Begin omeprazole

## 2022-02-28 NOTE — Assessment & Plan Note (Signed)
Continue Prozac

## 2022-03-01 ENCOUNTER — Other Ambulatory Visit: Payer: Self-pay | Admitting: Critical Care Medicine

## 2022-03-01 DIAGNOSIS — N6311 Unspecified lump in the right breast, upper outer quadrant: Secondary | ICD-10-CM

## 2022-03-13 ENCOUNTER — Other Ambulatory Visit: Payer: Medicare Other

## 2022-07-03 ENCOUNTER — Telehealth: Payer: Self-pay | Admitting: Emergency Medicine

## 2022-07-03 NOTE — Telephone Encounter (Signed)
Copied from [place] [id number]. Topic: Appointment Scheduling - Scheduling Inquiry for Clinic >> Jul 03, 2022 10:11 AM Everette C wrote: Reason for CRM: The patient would like to schedule an appt with their counselor when possible  Please contact further when available

## 2022-07-04 ENCOUNTER — Ambulatory Visit: Payer: Medicare Other | Admitting: Critical Care Medicine

## 2022-07-16 ENCOUNTER — Other Ambulatory Visit: Payer: Self-pay | Admitting: Critical Care Medicine
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
## 2022-08-02 ENCOUNTER — Ambulatory Visit: Payer: Medicare Other | Admitting: Physician Assistant

## 2022-08-03 NOTE — Telephone Encounter (Signed)
called patient today to introduce [place] [facility] and to assess patients' mental health needs. Patient did not answer the phone. I was able to leave a brief message with the patient asking them to return the call. Patient was referred by PCP for counseling

## 2022-09-09 ENCOUNTER — Ambulatory Visit: Payer: Medicare Other | Admitting: Family

## 2022-09-19 ENCOUNTER — Emergency Department (HOSPITAL_BASED_OUTPATIENT_CLINIC_OR_DEPARTMENT_OTHER)
Admission: EM | Admit: 2022-09-19 | Discharge: 2022-09-19 | Disposition: A | Discharge status: Home / Self Care | Payer: Medicare Other | Attending: Emergency Medicine | Admitting: Emergency Medicine

## 2022-09-19 ENCOUNTER — Other Ambulatory Visit: Payer: Self-pay

## 2022-09-19 ENCOUNTER — Encounter (HOSPITAL_BASED_OUTPATIENT_CLINIC_OR_DEPARTMENT_OTHER): Payer: Self-pay

## 2022-09-19 ENCOUNTER — Telehealth: Payer: Self-pay | Admitting: Critical Care Medicine

## 2022-09-19 ENCOUNTER — Telehealth: Payer: Self-pay | Admitting: Emergency Medicine

## 2022-09-19 DIAGNOSIS — Z202 Contact with and (suspected) exposure to infections with a predominantly sexual mode of transmission: Secondary | ICD-10-CM

## 2022-09-19 DIAGNOSIS — R369 Urethral discharge, unspecified: Secondary | ICD-10-CM | POA: Diagnosis present

## 2022-09-19 DIAGNOSIS — A599 Trichomoniasis, unspecified: Secondary | ICD-10-CM | POA: Insufficient documentation

## 2022-09-19 DIAGNOSIS — A64 Unspecified sexually transmitted disease: Secondary | ICD-10-CM | POA: Insufficient documentation

## 2022-09-19 LAB — URINALYSIS, ROUTINE W REFLEX MICROSCOPIC
Bilirubin Urine: NEGATIVE
Glucose, UA: NEGATIVE mg/dL
Ketones, ur: NEGATIVE mg/dL
Leukocytes,Ua: NEGATIVE
Nitrite: NEGATIVE
Protein, ur: NEGATIVE mg/dL
Specific Gravity, Urine: 1.025 (ref 1.005–1.030)
pH: 6.5 (ref 5.0–8.0)

## 2022-09-19 LAB — URINALYSIS, MICROSCOPIC (REFLEX): WBC, UA: NONE SEEN WBC/hpf (ref 0–5)

## 2022-09-19 MED ORDER — AZITHROMYCIN 250 MG PO TABS
1000.0000 mg | ORAL_TABLET | Freq: Once | ORAL | Status: AC
  Administered 2022-09-19: 1000 mg via ORAL
  Filled 2022-09-19: qty 4

## 2022-09-19 MED ORDER — LIDOCAINE HCL (PF) 1 % IJ SOLN
1.0000 mL | Freq: Once | INTRAMUSCULAR | Status: AC
  Administered 2022-09-19: 1 mL
  Filled 2022-09-19: qty 5

## 2022-09-19 MED ORDER — METRONIDAZOLE 500 MG PO TABS: 500.0000 mg | ORAL_TABLET | Freq: Two times a day (BID) | ORAL | 0 refills | Status: DC

## 2022-09-19 MED ORDER — CEFTRIAXONE SODIUM 500 MG IJ SOLR
500.0000 mg | Freq: Once | INTRAMUSCULAR | Status: AC
  Administered 2022-09-19: 500 mg via INTRAMUSCULAR
  Filled 2022-09-19: qty 500

## 2022-09-19 NOTE — Telephone Encounter (Signed)
Copied from [place] [phone]. Topic: Appointment Scheduling - Scheduling Inquiry for Clinic >> Sep 19, 2022  7:34 AM Penni Bombard wrote: Reason for CRM: pt called needing refills on about 4 or 5 medications.  I see he has not been in in a while. He is unsure about everything he needs.  Could a nurse call him back.  He says he called the pharmacy.  Not sure if he needs an appt  CB [phone]  or   [phone]

## 2022-09-19 NOTE — Telephone Encounter (Signed)
Talked with patient and pharmacy and had all medication filled and scheduled him a follow up appointment
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
## 2022-09-19 NOTE — Discharge Instructions (Signed)
Take the antibiotic Flagyl as directed for the next 7 days.  Return for any new or worse symptoms.

## 2022-09-19 NOTE — ED Triage Notes (Signed)
Pt reports STD exposure and would like to be tested. Pt reports penile discharge and abdominal discomfort, pt denies pain at present.

## 2022-09-19 NOTE — Telephone Encounter (Signed)
.  pec

## 2022-09-20 LAB — GC/CHLAMYDIA PROBE AMP (~~LOC~~) NOT AT ARMC
Chlamydia: NEGATIVE
Comment: NEGATIVE
Comment: NORMAL
Neisseria Gonorrhea: NEGATIVE

## 2022-09-20 LAB — RPR: RPR Ser Ql: NONREACTIVE

## 2022-09-20 LAB — HIV ANTIBODY (ROUTINE TESTING W REFLEX): HIV Screen 4th Generation wRfx: NONREACTIVE

## 2022-09-28 ENCOUNTER — Ambulatory Visit: Payer: Self-pay | Admitting: *Deleted

## 2022-09-28 ENCOUNTER — Other Ambulatory Visit (HOSPITAL_COMMUNITY): Payer: Self-pay

## 2022-09-28 NOTE — Telephone Encounter (Signed)
Transferred to nurse Triage.

## 2022-09-28 NOTE — Telephone Encounter (Signed)
Patient is having trouble getting his medications- he lost his card. Patient states he is is having trouble at the pharmacy. Patient has not had his medication in 1 week and he is having SE from not taking it.  Patient has been getting his Rx filled at his local [place]. Patient has been missing Prozac, cyclobenzaprine, naproxen. Reason for Disposition . [1] Caller has URGENT medicine question about med that PCP or specialist prescribed AND [2] triager unable to answer question  Answer Assessment - Initial Assessment Questions 1. NAME of MEDICINE: "What medicine(s) are you calling about?"     Prozac, cyclobenzaprine, naproxen 2. QUESTION: "What is your question?" (e.g., double dose of medicine, side effect)     Unable to fill- patient lost his [facility] card and pharmacy is unable to pull that information 3. PRESCRIBER: "Who prescribed the medicine?" Reason: if prescribed by specialist, call should be referred to that group.     [place] 4. SYMPTOMS: "Do you have any symptoms?" If Yes, ask: "What symptoms are you having?"  "How bad are the symptoms (e.g., mild, moderate, severe)     Concentration, anxiety, mood swing, pain  Call to [place] they are able to pull patient insurance information up-and feel sure that they can fill these medications if patient has them transferred. Patient has been advised of possible closer location- [place]. Patient advised to call pharmacy of choice and request to have his medications transferred from [facility]- they will take care of that for him. Patient advised if he feels worse today be seen at ED. Patient states he should be able to pick up medication tomorrow.  Protocols used: Medication Question Call-A-AH

## 2022-09-28 NOTE — Telephone Encounter (Signed)
  Chief Complaint: medication problem- patient lost [facility] card and is having trouble getting medication  Symptoms: patient states he has not had all of his medication for 1 week- he is having WD symptoms- mood swings, trouble concentrating Frequency: 1 week  Disposition: [] ED /[] Urgent Care (no appt availability in office) / [] Appointment(In office/virtual)/ []  [place] [facility]/ [] Home Care/ [] Refused Recommended Disposition /[] [place] [facility]/ [x]  Follow-up with PCP Additional Notes: Patient advised I would see how I could help him get his medications-  I did call [facility] pharmacy and they advised they can pull his insurance card. Patient advised how to transfer his Rx and pharmacy options given - he will decide which [facility] pharmacy is close to him. Patient should be able to get his medication tomorrow- advised ED if he should get worse with his symptoms.

## 2022-09-29 ENCOUNTER — Other Ambulatory Visit (HOSPITAL_BASED_OUTPATIENT_CLINIC_OR_DEPARTMENT_OTHER): Payer: Self-pay
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
## 2022-10-02 DIAGNOSIS — M75122 Complete rotator cuff tear or rupture of left shoulder, not specified as traumatic: Secondary | ICD-10-CM | POA: Insufficient documentation

## 2022-10-03 ENCOUNTER — Other Ambulatory Visit: Payer: Self-pay

## 2022-10-03 ENCOUNTER — Ambulatory Visit: Payer: Medicare Other | Attending: Critical Care Medicine | Admitting: Critical Care Medicine

## 2022-10-03 ENCOUNTER — Encounter: Payer: Self-pay | Admitting: Critical Care Medicine

## 2022-10-03 VITALS — BP 149/90 | HR 54 | Ht 67.0 in | Wt 212.2 lb

## 2022-10-03 DIAGNOSIS — Z86718 Personal history of other venous thrombosis and embolism: Secondary | ICD-10-CM | POA: Insufficient documentation

## 2022-10-03 DIAGNOSIS — G894 Chronic pain syndrome: Secondary | ICD-10-CM

## 2022-10-03 DIAGNOSIS — Z760 Encounter for issue of repeat prescription: Secondary | ICD-10-CM | POA: Insufficient documentation

## 2022-10-03 DIAGNOSIS — Z79899 Other long term (current) drug therapy: Secondary | ICD-10-CM | POA: Diagnosis not present

## 2022-10-03 DIAGNOSIS — R42 Dizziness and giddiness: Secondary | ICD-10-CM | POA: Diagnosis not present

## 2022-10-03 DIAGNOSIS — K5900 Constipation, unspecified: Secondary | ICD-10-CM | POA: Insufficient documentation

## 2022-10-03 DIAGNOSIS — Z139 Encounter for screening, unspecified: Secondary | ICD-10-CM

## 2022-10-03 DIAGNOSIS — Z933 Colostomy status: Secondary | ICD-10-CM | POA: Insufficient documentation

## 2022-10-03 DIAGNOSIS — Z87891 Personal history of nicotine dependence: Secondary | ICD-10-CM | POA: Insufficient documentation

## 2022-10-03 DIAGNOSIS — G629 Polyneuropathy, unspecified: Secondary | ICD-10-CM | POA: Diagnosis not present

## 2022-10-03 DIAGNOSIS — F419 Anxiety disorder, unspecified: Secondary | ICD-10-CM

## 2022-10-03 DIAGNOSIS — M79605 Pain in left leg: Secondary | ICD-10-CM | POA: Diagnosis not present

## 2022-10-03 DIAGNOSIS — M545 Low back pain, unspecified: Secondary | ICD-10-CM | POA: Diagnosis not present

## 2022-10-03 DIAGNOSIS — M75102 Unspecified rotator cuff tear or rupture of left shoulder, not specified as traumatic: Secondary | ICD-10-CM | POA: Diagnosis not present

## 2022-10-03 DIAGNOSIS — Z791 Long term (current) use of non-steroidal anti-inflammatories (NSAID): Secondary | ICD-10-CM | POA: Diagnosis not present

## 2022-10-03 DIAGNOSIS — F1290 Cannabis use, unspecified, uncomplicated: Secondary | ICD-10-CM | POA: Diagnosis not present

## 2022-10-03 DIAGNOSIS — I10 Essential (primary) hypertension: Secondary | ICD-10-CM | POA: Diagnosis not present

## 2022-10-03 DIAGNOSIS — N6311 Unspecified lump in the right breast, upper outer quadrant: Secondary | ICD-10-CM

## 2022-10-03 DIAGNOSIS — M75122 Complete rotator cuff tear or rupture of left shoulder, not specified as traumatic: Secondary | ICD-10-CM | POA: Diagnosis not present

## 2022-10-03 DIAGNOSIS — G8929 Other chronic pain: Secondary | ICD-10-CM | POA: Insufficient documentation

## 2022-10-03 DIAGNOSIS — R1013 Epigastric pain: Secondary | ICD-10-CM

## 2022-10-03 DIAGNOSIS — R110 Nausea: Secondary | ICD-10-CM

## 2022-10-03 DIAGNOSIS — M5416 Radiculopathy, lumbar region: Secondary | ICD-10-CM | POA: Insufficient documentation

## 2022-10-03 DIAGNOSIS — M25571 Pain in right ankle and joints of right foot: Secondary | ICD-10-CM | POA: Insufficient documentation

## 2022-10-03 DIAGNOSIS — F32A Depression, unspecified: Secondary | ICD-10-CM | POA: Diagnosis not present

## 2022-10-03 MED ORDER — AMLODIPINE BESYLATE 10 MG PO TABS
5.0000 mg | ORAL_TABLET | Freq: Every day | ORAL | 2 refills | Status: DC
  Filled 2022-10-03: qty 45, 90d supply, fill #0

## 2022-10-03 MED ORDER — OMEPRAZOLE 20 MG PO CPDR
20.0000 mg | DELAYED_RELEASE_CAPSULE | Freq: Two times a day (BID) | ORAL | 2 refills | Status: DC
  Filled 2022-10-03: qty 90, 45d supply, fill #0

## 2022-10-03 MED ORDER — NAPROXEN 500 MG PO TABS
500.0000 mg | ORAL_TABLET | Freq: Two times a day (BID) | ORAL | 1 refills | Status: DC
  Filled 2022-10-03: qty 60, 30d supply, fill #0

## 2022-10-03 MED ORDER — DICLOFENAC SODIUM 1 % EX GEL
CUTANEOUS | 2 refills | Status: DC
  Filled 2022-10-03: qty 100, 13d supply, fill #0

## 2022-10-03 MED ORDER — DULOXETINE HCL 60 MG PO CPEP
60.0000 mg | ORAL_CAPSULE | Freq: Every day | ORAL | 2 refills | Status: DC
  Filled 2022-10-03: qty 60, 60d supply, fill #0

## 2022-10-03 MED ORDER — CYCLOBENZAPRINE HCL 5 MG PO TABS
5.0000 mg | ORAL_TABLET | Freq: Three times a day (TID) | ORAL | 2 refills | Status: DC | PRN
  Filled 2022-10-03: qty 60, 20d supply, fill #0

## 2022-10-03 NOTE — Assessment & Plan Note (Signed)
Start trial of Cymbalta 60 mg daily for chronic pain and anxiety and depression

## 2022-10-03 NOTE — Assessment & Plan Note (Signed)
Management per orthopedics. 

## 2022-10-03 NOTE — Patient Instructions (Addendum)
Return Dr Joya Gaskins 6 weeks Refills on medication sent to our pharmacy Start amlodipine and cymbalta  Labs today  Referral to pain clinic sent

## 2022-10-03 NOTE — Assessment & Plan Note (Signed)
Management for orthopedics

## 2022-10-03 NOTE — Assessment & Plan Note (Signed)
Never had imaging will need to follow-up

## 2022-10-03 NOTE — Assessment & Plan Note (Signed)
New diagnosis of hypertension made we will begin amlodipine 10 mg daily bring patient back short-term follow-up check metabolic panel and blood count

## 2022-10-03 NOTE — Assessment & Plan Note (Signed)
Referral to pain management was made

## 2022-10-04 ENCOUNTER — Other Ambulatory Visit: Payer: Self-pay | Admitting: Critical Care Medicine

## 2022-10-04 LAB — COMPREHENSIVE METABOLIC PANEL
ALT: 14 IU/L (ref 0–44)
AST: 19 IU/L (ref 0–40)
Albumin/Globulin Ratio: 1.7 (ref 1.2–2.2)
Albumin: 4.2 g/dL (ref 4.1–5.1)
Alkaline Phosphatase: 79 IU/L (ref 44–121)
BUN/Creatinine Ratio: 13 (ref 9–20)
BUN: 14 mg/dL (ref 6–24)
Bilirubin Total: <0.2 mg/dL (ref 0.0–1.2)
CO2: 19 mmol/L — ABNORMAL LOW (ref 20–29)
Calcium: 9.2 mg/dL (ref 8.7–10.2)
Chloride: 103 mmol/L (ref 96–106)
Creatinine, Ser: 1.12 mg/dL (ref 0.76–1.27)
Globulin, Total: 2.5 g/dL (ref 1.5–4.5)
Glucose: 93 mg/dL (ref 70–99)
Potassium: 4.4 mmol/L (ref 3.5–5.2)
Sodium: 139 mmol/L (ref 134–144)
Total Protein: 6.7 g/dL (ref 6.0–8.5)
eGFR: 81 mL/min/{1.73_m2} (ref >59)

## 2022-10-04 LAB — LIPID PANEL
Chol/HDL Ratio: 5.6 ratio — ABNORMAL HIGH (ref 0.0–5.0)
Cholesterol, Total: 240 mg/dL — ABNORMAL HIGH (ref 100–199)
HDL: 43 mg/dL (ref >39)
LDL Chol Calc (NIH): 165 mg/dL — ABNORMAL HIGH (ref 0–99)
Triglycerides: 176 mg/dL — ABNORMAL HIGH (ref 0–149)
VLDL Cholesterol Cal: 32 mg/dL (ref 5–40)

## 2022-10-04 LAB — CBC WITH DIFFERENTIAL/PLATELET
Basophils Absolute: 0 10*3/uL (ref 0.0–0.2)
Basos: 1 %
EOS (ABSOLUTE): 0.3 10*3/uL (ref 0.0–0.4)
Eos: 3 %
Hematocrit: 44.4 % (ref 37.5–51.0)
Hemoglobin: 14.5 g/dL (ref 13.0–17.7)
Immature Grans (Abs): 0 10*3/uL (ref 0.0–0.1)
Immature Granulocytes: 0 %
Lymphocytes Absolute: 3.1 10*3/uL (ref 0.7–3.1)
Lymphs: 36 %
MCH: 26.7 pg (ref 26.6–33.0)
MCHC: 32.7 g/dL (ref 31.5–35.7)
MCV: 82 fL (ref 79–97)
Monocytes Absolute: 0.7 10*3/uL (ref 0.1–0.9)
Monocytes: 8 %
Neutrophils Absolute: 4.6 10*3/uL (ref 1.4–7.0)
Neutrophils: 52 %
Platelets: 296 10*3/uL (ref 150–450)
RBC: 5.43 x10E6/uL (ref 4.14–5.80)
RDW: 13.1 % (ref 11.6–15.4)
WBC: 8.8 10*3/uL (ref 3.4–10.8)

## 2022-10-04 MED ORDER — ATORVASTATIN CALCIUM 20 MG PO TABS: 20.0000 mg | ORAL_TABLET | Freq: Every day | ORAL | 3 refills | Status: DC

## 2022-10-04 NOTE — Progress Notes (Signed)
Let pt know all labs normal except very high cholesterol , cholesterol pill sent to pharmacy

## 2022-10-05 ENCOUNTER — Telehealth: Payer: Self-pay

## 2022-10-05 NOTE — Telephone Encounter (Signed)
-----   Message from Elsie Stain, MD sent at 10/04/2022 12:24 PM EDT ----- Let pt know all labs normal except very high cholesterol , cholesterol pill sent to pharmacy

## 2022-10-05 NOTE — Telephone Encounter (Signed)
Pt was called and vm was left, Information has been sent to nurse pool.   

## 2022-10-27 ENCOUNTER — Telehealth: Payer: Self-pay

## 2022-10-27 NOTE — Progress Notes (Signed)
Patient attempted to be outreached by Clovis Pu, PharmD Candidate on 4/12 to discuss hypertension. Left message with his wife for him to return our call at their convenience at [phone].   Clovis Pu, PharmD Candidate   Valeda Malm, Pharm.D. PGY-2 Ambulatory Care Pharmacy Resident

## 2022-10-30 ENCOUNTER — Telehealth: Payer: Self-pay | Admitting: Critical Care Medicine

## 2022-10-30 NOTE — Telephone Encounter (Signed)
Contacted Elishah Gouin to schedule their annual wellness visit. Appointment made for 11/02/22.  Rudell Cobb AWV direct phone # [phone]*(signature)*

## 2022-11-02 ENCOUNTER — Ambulatory Visit: Payer: Medicare Other | Attending: Critical Care Medicine

## 2022-11-02 VITALS — Ht 67.0 in | Wt 208.0 lb

## 2022-11-02 DIAGNOSIS — Z0000 Encounter for general adult medical examination without abnormal findings: Secondary | ICD-10-CM | POA: Diagnosis not present
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
## 2022-11-02 NOTE — Patient Instructions (Signed)
[name] , Thank you for taking time to come for your Medicare Wellness Visit. I appreciate your ongoing commitment to your health goals. Please review the following plan we discussed and let me know if I can assist you in the future.   These are the goals we discussed:  Goals      Patient Stated     11/02/2022, wants to keep mental state up to par        This is a list of the screening recommended for you and due dates:  Health Maintenance  Topic Date Due   COVID-19 Vaccine (1) Never done   Hepatitis C Screening: USPSTF Recommendation to screen - Ages 65-79 yo.  Never done   Flu Shot  02/15/2023   Medicare Annual Wellness Visit  11/02/2023   DTaP/Tdap/Td vaccine (2 - Td or Tdap) 05/22/2025   Colon Cancer Screening  02/09/2029   HIV Screening  Completed   HPV Vaccine  Aged Out    Advanced directives: Advance directive discussed with you today.   Conditions/risks identified: none  Next appointment: Follow up in one year for your annual wellness visit   Preventive Care 40-64 Years, Male Preventive care refers to lifestyle choices and visits with your health care provider that can promote health and wellness. What does preventive care include? A yearly physical exam. This is also called an annual well check. Dental exams once or twice a year. Routine eye exams. Ask your health care provider how often you should have your eyes checked. Personal lifestyle choices, including: Daily care of your teeth and gums. Regular physical activity. Eating a healthy diet. Avoiding tobacco and drug use. Limiting alcohol use. Practicing safe sex. Taking low-dose aspirin every day starting at age 68. What happens during an annual well check? The services and screenings done by your health care provider during your annual well check will depend on your age, overall health, lifestyle risk factors, and family history of disease. Counseling  Your health care provider may ask you questions about  your: Alcohol use. Tobacco use. Drug use. Emotional well-being. Home and relationship well-being. Sexual activity. Eating habits. Work and work Astronomer. Screening  You may have the following tests or measurements: Height, weight, and BMI. Blood pressure. Lipid and cholesterol levels. These may be checked every 5 years, or more frequently if you are over 31 years old. Skin check. Lung cancer screening. You may have this screening every year starting at age 73 if you have a 30-pack-year history of smoking and currently smoke or have quit within the past 15 years. Fecal occult blood test (FOBT) of the stool. You may have this test every year starting at age 43. Flexible sigmoidoscopy or colonoscopy. You may have a sigmoidoscopy every 5 years or a colonoscopy every 10 years starting at age 41. Prostate cancer screening. Recommendations will vary depending on your family history and other risks. Hepatitis C blood test. Hepatitis B blood test. Sexually transmitted disease (STD) testing. Diabetes screening. This is done by checking your blood sugar (glucose) after you have not eaten for a while (fasting). You may have this done every 1-3 years. Discuss your test results, treatment options, and if necessary, the need for more tests with your health care provider. Vaccines  Your health care provider may recommend certain vaccines, such as: Influenza vaccine. This is recommended every year. Tetanus, diphtheria, and acellular pertussis (Tdap, Td) vaccine. You may need a Td booster every 10 years. Zoster vaccine. You may need this after  age 34. Pneumococcal 13-valent conjugate (PCV13) vaccine. You may need this if you have certain conditions and have not been vaccinated. Pneumococcal polysaccharide (PPSV23) vaccine. You may need one or two doses if you smoke cigarettes or if you have certain conditions. Talk to your health care provider about which screenings and vaccines you need and how  often you need them. This information is not intended to replace advice given to you by your health care provider. Make sure you discuss any questions you have with your health care provider. Document Released: 07/30/2015 Document Revised: 03/22/2016 Document Reviewed: 05/04/2015 Elsevier Interactive Patient Education  2017 [employer].  Fall Prevention in the Home Falls can cause injuries. They can happen to people of all ages. There are many things you can do to make your home safe and to help prevent falls. What can I do on the outside of my home? Regularly fix the edges of walkways and driveways and fix any cracks. Remove anything that might make you trip as you walk through a door, such as a raised step or threshold. Trim any bushes or trees on the path to your home. Use bright outdoor lighting. Clear any walking paths of anything that might make someone trip, such as rocks or tools. Regularly check to see if handrails are loose or broken. Make sure that both sides of any steps have handrails. Any raised decks and porches should have guardrails on the edges. Have any leaves, snow, or ice cleared regularly. Use sand or salt on walking paths during winter. Clean up any spills in your garage right away. This includes oil or grease spills. What can I do in the bathroom? Use night lights. Install grab bars by the toilet and in the tub and shower. Do not use towel bars as grab bars. Use non-skid mats or decals in the tub or shower. If you need to sit down in the shower, use a plastic, non-slip stool. Keep the floor dry. Clean up any water that spills on the floor as soon as it happens. Remove soap buildup in the tub or shower regularly. Attach bath mats securely with double-sided non-slip rug tape. Do not have throw rugs and other things on the floor that can make you trip. What can I do in the bedroom? Use night lights. Make sure that you have a light by your bed that is easy to  reach. Do not use any sheets or blankets that are too big for your bed. They should not hang down onto the floor. Have a firm chair that has side arms. You can use this for support while you get dressed. Do not have throw rugs and other things on the floor that can make you trip. What can I do in the kitchen? Clean up any spills right away. Avoid walking on wet floors. Keep items that you use a lot in easy-to-reach places. If you need to reach something above you, use a strong step stool that has a grab bar. Keep electrical cords out of the way. Do not use floor polish or wax that makes floors slippery. If you must use wax, use non-skid floor wax. Do not have throw rugs and other things on the floor that can make you trip. What can I do with my stairs? Do not leave any items on the stairs. Make sure that there are handrails on both sides of the stairs and use them. Fix handrails that are broken or loose. Make sure that handrails are as long  as the stairways. Check any carpeting to make sure that it is firmly attached to the stairs. Fix any carpet that is loose or worn. Avoid having throw rugs at the top or bottom of the stairs. If you do have throw rugs, attach them to the floor with carpet tape. Make sure that you have a light switch at the top of the stairs and the bottom of the stairs. If you do not have them, ask someone to add them for you. What else can I do to help prevent falls? Wear shoes that: Do not have high heels. Have rubber bottoms. Are comfortable and fit you well. Are closed at the toe. Do not wear sandals. If you use a stepladder: Make sure that it is fully opened. Do not climb a closed stepladder. Make sure that both sides of the stepladder are locked into place. Ask someone to hold it for you, if possible. Clearly mark and make sure that you can see: Any grab bars or handrails. First and last steps. Where the edge of each step is. Use tools that help you move  around (mobility aids) if they are needed. These include: Canes. Walkers. Scooters. Crutches. Turn on the lights when you go into a dark area. Replace any light bulbs as soon as they burn out. Set up your furniture so you have a clear path. Avoid moving your furniture around. If any of your floors are uneven, fix them. If there are any pets around you, be aware of where they are. Review your medicines with your doctor. Some medicines can make you feel dizzy. This can increase your chance of falling. Ask your doctor what other things that you can do to help prevent falls. This information is not intended to replace advice given to you by your health care provider. Make sure you discuss any questions you have with your health care provider. Document Released: 04/29/2009 Document Revised: 12/09/2015 Document Reviewed: 08/07/2014 Elsevier Interactive Patient Education  2017 [employer].

## 2022-11-21 ENCOUNTER — Ambulatory Visit: Payer: Medicare Other | Admitting: Critical Care Medicine
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
## 2022-11-30 ENCOUNTER — Other Ambulatory Visit: Payer: Self-pay

## 2022-12-21 DIAGNOSIS — M25512 Pain in left shoulder: Secondary | ICD-10-CM | POA: Diagnosis not present

## 2022-12-21 DIAGNOSIS — Z9889 Other specified postprocedural states: Secondary | ICD-10-CM | POA: Diagnosis not present

## 2022-12-21 DIAGNOSIS — Z7409 Other reduced mobility: Secondary | ICD-10-CM | POA: Diagnosis not present

## 2022-12-21 DIAGNOSIS — G8929 Other chronic pain: Secondary | ICD-10-CM | POA: Diagnosis not present

## 2022-12-21 DIAGNOSIS — M25612 Stiffness of left shoulder, not elsewhere classified: Secondary | ICD-10-CM | POA: Diagnosis not present

## 2022-12-21 DIAGNOSIS — Z789 Other specified health status: Secondary | ICD-10-CM | POA: Diagnosis not present

## 2022-12-21 DIAGNOSIS — R29898 Other symptoms and signs involving the musculoskeletal system: Secondary | ICD-10-CM | POA: Diagnosis not present

## 2022-12-27 DIAGNOSIS — Z9889 Other specified postprocedural states: Secondary | ICD-10-CM | POA: Diagnosis not present

## 2022-12-28 DIAGNOSIS — Z789 Other specified health status: Secondary | ICD-10-CM | POA: Diagnosis not present

## 2022-12-28 DIAGNOSIS — R29898 Other symptoms and signs involving the musculoskeletal system: Secondary | ICD-10-CM | POA: Diagnosis not present

## 2022-12-28 DIAGNOSIS — Z7409 Other reduced mobility: Secondary | ICD-10-CM | POA: Diagnosis not present

## 2022-12-28 DIAGNOSIS — M25612 Stiffness of left shoulder, not elsewhere classified: Secondary | ICD-10-CM | POA: Diagnosis not present

## 2022-12-28 DIAGNOSIS — Z9889 Other specified postprocedural states: Secondary | ICD-10-CM | POA: Diagnosis not present

## 2022-12-28 DIAGNOSIS — G8929 Other chronic pain: Secondary | ICD-10-CM | POA: Diagnosis not present

## 2022-12-28 DIAGNOSIS — M25512 Pain in left shoulder: Secondary | ICD-10-CM | POA: Diagnosis not present

## 2022-12-30 DIAGNOSIS — Y9389 Activity, other specified: Secondary | ICD-10-CM | POA: Diagnosis not present

## 2022-12-30 DIAGNOSIS — S61551A Open bite of right wrist, initial encounter: Secondary | ICD-10-CM | POA: Diagnosis not present

## 2022-12-30 DIAGNOSIS — Y929 Unspecified place or not applicable: Secondary | ICD-10-CM | POA: Diagnosis not present

## 2022-12-30 DIAGNOSIS — S61052A Open bite of left thumb without damage to nail, initial encounter: Secondary | ICD-10-CM | POA: Diagnosis not present

## 2022-12-30 DIAGNOSIS — W540XXA Bitten by dog, initial encounter: Secondary | ICD-10-CM | POA: Diagnosis not present

## 2023-01-01 DIAGNOSIS — G8929 Other chronic pain: Secondary | ICD-10-CM | POA: Diagnosis not present

## 2023-01-01 DIAGNOSIS — Z7409 Other reduced mobility: Secondary | ICD-10-CM | POA: Diagnosis not present

## 2023-01-01 DIAGNOSIS — Z9889 Other specified postprocedural states: Secondary | ICD-10-CM | POA: Diagnosis not present

## 2023-01-01 DIAGNOSIS — M25612 Stiffness of left shoulder, not elsewhere classified: Secondary | ICD-10-CM | POA: Diagnosis not present

## 2023-01-01 DIAGNOSIS — M25512 Pain in left shoulder: Secondary | ICD-10-CM | POA: Diagnosis not present

## 2023-01-01 DIAGNOSIS — R29898 Other symptoms and signs involving the musculoskeletal system: Secondary | ICD-10-CM | POA: Diagnosis not present

## 2023-01-01 DIAGNOSIS — Z789 Other specified health status: Secondary | ICD-10-CM | POA: Diagnosis not present

## 2023-01-04 DIAGNOSIS — M25512 Pain in left shoulder: Secondary | ICD-10-CM | POA: Diagnosis not present

## 2023-01-04 DIAGNOSIS — R29898 Other symptoms and signs involving the musculoskeletal system: Secondary | ICD-10-CM | POA: Diagnosis not present

## 2023-01-04 DIAGNOSIS — Z7409 Other reduced mobility: Secondary | ICD-10-CM | POA: Diagnosis not present

## 2023-01-04 DIAGNOSIS — Z9889 Other specified postprocedural states: Secondary | ICD-10-CM | POA: Diagnosis not present

## 2023-01-04 DIAGNOSIS — G8929 Other chronic pain: Secondary | ICD-10-CM | POA: Diagnosis not present

## 2023-01-04 DIAGNOSIS — M25612 Stiffness of left shoulder, not elsewhere classified: Secondary | ICD-10-CM | POA: Diagnosis not present

## 2023-01-04 DIAGNOSIS — Z789 Other specified health status: Secondary | ICD-10-CM | POA: Diagnosis not present

## 2023-01-10 DIAGNOSIS — R29898 Other symptoms and signs involving the musculoskeletal system: Secondary | ICD-10-CM | POA: Diagnosis not present

## 2023-01-10 DIAGNOSIS — M25612 Stiffness of left shoulder, not elsewhere classified: Secondary | ICD-10-CM | POA: Diagnosis not present

## 2023-01-10 DIAGNOSIS — Z9889 Other specified postprocedural states: Secondary | ICD-10-CM | POA: Diagnosis not present

## 2023-01-10 DIAGNOSIS — Z789 Other specified health status: Secondary | ICD-10-CM | POA: Diagnosis not present

## 2023-01-10 DIAGNOSIS — G8929 Other chronic pain: Secondary | ICD-10-CM | POA: Diagnosis not present

## 2023-01-10 DIAGNOSIS — M25512 Pain in left shoulder: Secondary | ICD-10-CM | POA: Diagnosis not present

## 2023-01-10 DIAGNOSIS — Z7409 Other reduced mobility: Secondary | ICD-10-CM | POA: Diagnosis not present

## 2023-01-12 DIAGNOSIS — Z9889 Other specified postprocedural states: Secondary | ICD-10-CM | POA: Diagnosis not present

## 2023-01-12 DIAGNOSIS — R29898 Other symptoms and signs involving the musculoskeletal system: Secondary | ICD-10-CM | POA: Diagnosis not present

## 2023-01-12 DIAGNOSIS — M25612 Stiffness of left shoulder, not elsewhere classified: Secondary | ICD-10-CM | POA: Diagnosis not present

## 2023-01-12 DIAGNOSIS — M25512 Pain in left shoulder: Secondary | ICD-10-CM | POA: Diagnosis not present

## 2023-01-12 DIAGNOSIS — G8929 Other chronic pain: Secondary | ICD-10-CM | POA: Diagnosis not present

## 2023-01-12 DIAGNOSIS — Z7409 Other reduced mobility: Secondary | ICD-10-CM | POA: Diagnosis not present

## 2023-01-12 DIAGNOSIS — Z789 Other specified health status: Secondary | ICD-10-CM | POA: Diagnosis not present

## 2023-01-19 DIAGNOSIS — M25512 Pain in left shoulder: Secondary | ICD-10-CM | POA: Diagnosis not present

## 2023-01-19 DIAGNOSIS — G8929 Other chronic pain: Secondary | ICD-10-CM | POA: Diagnosis not present

## 2023-01-19 DIAGNOSIS — R29898 Other symptoms and signs involving the musculoskeletal system: Secondary | ICD-10-CM | POA: Diagnosis not present

## 2023-01-19 DIAGNOSIS — Z9889 Other specified postprocedural states: Secondary | ICD-10-CM | POA: Diagnosis not present

## 2023-01-19 NOTE — Progress Notes (Signed)
 Physical Therapy Visit - Daily Note   Payor: AET[employer] MEDICARE ADV / Plan: AETNA [place] / Product Type: Medicare Advantage /   Visit Count: 7  Rehabilitation Precautions/Restrictions:   Precautions/Restrictions Precautions: HTN; axiety/depression; OA; s/p RCR with soft tissue transfer for supraspinatous, anterior labral stabilization        Referring Diagnosis: s/p L shoulder RCR (soft tissue graft for supraspinatous and anterior labral repair)   SUBJECTIVE Patient Report:   Pt states he is going out of town for a funeral for a 3 weeks but will be back. He has been working his shoulder a lot and feels like he is getting stronger every day.  Change in Status Since Last Visit: No Pain:  Pain Assessment Pain Assessment: 0-10 Pain Score  : 2  OBJECTIVE  General Observation/Objective Measures:       75 deg AROM shoulder flexion 40 deg AROM shoulder ABD L scapular depression       Interventions:   Manual Therapy:(10 minutes) - L posterior capsule stretch - L posterior GH mobilization gr. II-III  Therapeutic Exercise: - Sidelying shoulder ABD 15x - Supine shoulder flexion w/ ER 15x - Supine horizontal ABD 15x - Sidelying shoulder ER 15x - Seated scapular retractions 15x - Seated no monies 15x - Shoulder flexion walk backs 15x - Seated UT shrugs 15x   Education: Discussed HEP and POC    ASSESSMENT Pt tolerated today's session well. He continues to demonstrate fatigue with shoulder strengthening exercises in today's session. He is going out of town for a funeral and will return in a few weeks. We discussed his HEP and frequency of exercises as well as progressions to make sure he continues to progress while he is away. We will follow up when he returns.    Therapy Diagnosis:     ICD-10-CM   1. Chronic left shoulder pain  M25.512, G89.29   2. S/P rotator cuff repair  Z98.890   3. Weakness of left shoulder  R29.898     Progress Towards Goals:   Progressing well   Goals Addressed             This Visit's Progress    General Goal   On track    Short Term Goals (STG) - Time Frame: 8visits   STG 1: Pt will be independent with HEP to improve their ability to self-manage symptoms and reduce risk of recurrence   01-04-2023 goal achieved  STG 2: Pt will demonstrate PROM to the left shoulder elevation and scaption and ABD to at least 160 deg.  STG 3: Pt will demonstrate PROM to the left shoulder to at least 80 deg ER in scapular plane     Long Term Goals (LTG) - Time Frame 30 visits   LTG 1: Pt will demonstrate AROM of the left shoulder to at least 140 deg elevation to allow unlimited ADL's at home.  LTG 2: Pt will demonstrate AROM of hte left shoulder to allow pt to reach comfortably behind his head for grooming needs   LTG 3: Pt will improve Quick Dash by >/= 75%  to indicate improved functional ability.      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 Frequency and Duration:  PT Frequency: -- (2 x per week x 30 visits)  Recommended PT Treatment/Interventions: ADL skills [phone]); Dry needling (1-2 muscles) (  79439); Dry Needling (3+ muscles) (79438); Electrical stimulation-attended (02967); Electrical stimulation-unattended (02985); Gait training (02883); Iontophoresis [phone]); Manual therapy (97140); Mechanical traction (97014); Neuromuscular re-education [phone]); Physical Performance Testing (02249); Self-care/home management [phone]); Therapeutic activity (97530); Therapeutic exercise (97110); Ultrasound (02964); Vasopneumatic compression (02983)   Recommended Consults:  None currently  Development of Plan of Care:  Patient participated in plan of care development.  Total Treatment Time (Time & Untimed): Total Treatment Time: 38 Total Time in Timed Codes: Time in Timed Codes: 38     Treatment/Procedures Manual Therapy Tech, 1 + regions  minutes: 10 Therapeutic Exercises minutes: 28             The patient has been instructed to contact our clinic if any questions or problems should arise.

## 2023-03-29 ENCOUNTER — Ambulatory Visit: Payer: Medicare HMO | Admitting: Physician Assistant

## 2023-04-18 ENCOUNTER — Ambulatory Visit: Payer: Medicare HMO | Admitting: Physician Assistant

## 2023-06-26 DIAGNOSIS — R300 Dysuria: Secondary | ICD-10-CM | POA: Diagnosis not present
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
## 2023-09-13 ENCOUNTER — Ambulatory Visit: Payer: Medicare HMO | Admitting: Critical Care Medicine
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
## 2023-09-27 ENCOUNTER — Ambulatory Visit: Payer: Medicare HMO | Admitting: Critical Care Medicine

## 2023-11-06 ENCOUNTER — Ambulatory Visit: Payer: Medicare Other | Attending: Critical Care Medicine

## 2023-11-19 ENCOUNTER — Ambulatory Visit: Admitting: Family Medicine

## 2023-12-14 ENCOUNTER — Ambulatory Visit: Admitting: Nurse Practitioner

## 2023-12-20 ENCOUNTER — Ambulatory Visit: Admitting: Critical Care Medicine

## 2024-01-15 ENCOUNTER — Ambulatory Visit: Admitting: Nurse Practitioner

## 2024-01-21 DIAGNOSIS — L255 Unspecified contact dermatitis due to plants, except food: Secondary | ICD-10-CM | POA: Diagnosis not present

## 2024-01-21 DIAGNOSIS — L2089 Other atopic dermatitis: Secondary | ICD-10-CM | POA: Diagnosis not present

## 2024-07-05 DIAGNOSIS — R0602 Shortness of breath: Secondary | ICD-10-CM | POA: Diagnosis not present
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
## 2024-07-21 ENCOUNTER — Other Ambulatory Visit: Payer: Self-pay | Admitting: Critical Care Medicine

## 2024-07-21 ENCOUNTER — Encounter: Payer: Self-pay | Admitting: Internal Medicine

## 2024-07-21 ENCOUNTER — Ambulatory Visit: Attending: Internal Medicine | Admitting: Internal Medicine

## 2024-07-21 VITALS — BP 147/87 | HR 75 | Temp 98.2°F | Ht 67.0 in | Wt 223.0 lb

## 2024-07-21 DIAGNOSIS — F331 Major depressive disorder, recurrent, moderate: Secondary | ICD-10-CM | POA: Diagnosis not present

## 2024-07-21 DIAGNOSIS — F411 Generalized anxiety disorder: Secondary | ICD-10-CM

## 2024-07-21 DIAGNOSIS — M62838 Other muscle spasm: Secondary | ICD-10-CM

## 2024-07-21 DIAGNOSIS — M25562 Pain in left knee: Secondary | ICD-10-CM

## 2024-07-21 DIAGNOSIS — R7303 Prediabetes: Secondary | ICD-10-CM | POA: Diagnosis not present

## 2024-07-21 DIAGNOSIS — E782 Mixed hyperlipidemia: Secondary | ICD-10-CM | POA: Diagnosis not present

## 2024-07-21 DIAGNOSIS — I10 Essential (primary) hypertension: Secondary | ICD-10-CM | POA: Diagnosis not present

## 2024-07-21 DIAGNOSIS — R631 Polydipsia: Secondary | ICD-10-CM

## 2024-07-21 DIAGNOSIS — Z23 Encounter for immunization: Secondary | ICD-10-CM | POA: Diagnosis not present

## 2024-07-21 DIAGNOSIS — G8929 Other chronic pain: Secondary | ICD-10-CM

## 2024-07-21 DIAGNOSIS — M25551 Pain in right hip: Secondary | ICD-10-CM

## 2024-07-21 LAB — POCT GLYCOSYLATED HEMOGLOBIN (HGB A1C): HbA1c, POC (prediabetic range): 6.3 % (ref 5.7–6.4)

## 2024-07-21 LAB — GLUCOSE, POCT (MANUAL RESULT ENTRY): POC Glucose: 120 mg/dL — AB (ref 70–99)

## 2024-07-21 MED ORDER — CYCLOBENZAPRINE HCL 5 MG PO TABS: 5.0000 mg | ORAL_TABLET | Freq: Every day | ORAL | 2 refills | Status: AC | PRN

## 2024-07-21 MED ORDER — ATORVASTATIN CALCIUM 20 MG PO TABS: 20.0000 mg | ORAL_TABLET | Freq: Every day | ORAL | 1 refills | Status: AC

## 2024-07-21 MED ORDER — FLUOXETINE HCL 10 MG PO CAPS: 10.0000 mg | ORAL_CAPSULE | Freq: Every day | ORAL | 1 refills | Status: AC

## 2024-07-21 MED ORDER — PREVNAR 20 0.5 ML IM SUSY: 0.5000 mL | PREFILLED_SYRINGE | INTRAMUSCULAR | 0 refills | Status: AC

## 2024-07-21 MED ORDER — DICLOFENAC SODIUM 1 % EX GEL: 2.0000 g | Freq: Two times a day (BID) | CUTANEOUS | 1 refills | Status: AC | PRN

## 2024-07-21 MED ORDER — AMLODIPINE BESYLATE 5 MG PO TABS: 5.0000 mg | ORAL_TABLET | Freq: Every day | ORAL | 1 refills | Status: AC

## 2024-07-21 NOTE — Telephone Encounter (Signed)
 Copied from CRM [id number]. Topic: Clinical - Medication Refill >> Jul 21, 2024 11:00 AM Antony RAMAN wrote: Medication: FLUoxetine  (PROZAC ) 10 MG capsule  Has the patient contacted their pharmacy? Yes (Agent: If no, request that the patient contact the pharmacy for the refill. If patient does not wish to contact the pharmacy document the reason why and proceed with request.) (Agent: If yes, when and what did the pharmacy advise?)  This is the patient's preferred pharmacy:  [id number] - [place], [place] - [address] [place] [address] Phone: [phone] Fax: [phone]   Is this the correct pharmacy for this prescription? Yes If no, delete pharmacy and type the correct one.   Has the prescription been filled recently? No  Is the patient out of the medication? No  Has the patient been seen for an appointment in the last year OR does the patient have an upcoming appointment? Yes  Can we respond through MyChart? Yes  Agent: Please be advised that Rx refills may take up to 3 business days. We ask that you follow-up with your pharmacy.
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
## 2024-07-22 ENCOUNTER — Ambulatory Visit: Payer: Self-pay | Admitting: Internal Medicine

## 2024-07-22 LAB — LIPID PANEL
Chol/HDL Ratio: 5.8 ratio — ABNORMAL HIGH (ref 0.0–5.0)
Cholesterol, Total: 220 mg/dL — ABNORMAL HIGH (ref 100–199)
HDL: 38 mg/dL — ABNORMAL LOW
LDL Chol Calc (NIH): 137 mg/dL — ABNORMAL HIGH (ref 0–99)
Triglycerides: 247 mg/dL — ABNORMAL HIGH (ref 0–149)
VLDL Cholesterol Cal: 45 mg/dL — ABNORMAL HIGH (ref 5–40)
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
## 2024-07-24 NOTE — Telephone Encounter (Signed)
 Copied from CRM [id number]. Topic: Clinical - Lab/Test Results >> Jul 24, 2024  8:31 AM Amy B wrote:  Reason for CRM: Patient returned call for lab results.  Relayed results to patient and informed him a prescription was called in to his pharmacy according to the chart notes.

## 2024-07-24 NOTE — Telephone Encounter (Signed)
Sending as a Financial planner

## 2024-08-04 ENCOUNTER — Ambulatory Visit: Admitting: Physician Assistant
